# Patient Record
Sex: Female | Born: 1945
Health system: Southern US, Community
[De-identification: ages and names within clinical notes are randomized; demographics above are authoritative.]

## PROBLEM LIST (undated history)

## (undated) DIAGNOSIS — E0500 Thyrotoxicosis with diffuse goiter without thyrotoxic crisis or storm: Secondary | ICD-10-CM

## (undated) DIAGNOSIS — J45909 Unspecified asthma, uncomplicated: Secondary | ICD-10-CM

## (undated) DIAGNOSIS — M1990 Unspecified osteoarthritis, unspecified site: Secondary | ICD-10-CM

## (undated) DIAGNOSIS — I10 Essential (primary) hypertension: Secondary | ICD-10-CM

## (undated) DIAGNOSIS — E785 Hyperlipidemia, unspecified: Secondary | ICD-10-CM

## (undated) DIAGNOSIS — E039 Hypothyroidism, unspecified: Secondary | ICD-10-CM

## (undated) DIAGNOSIS — E119 Type 2 diabetes mellitus without complications: Secondary | ICD-10-CM

## (undated) DIAGNOSIS — R519 Headache, unspecified: Secondary | ICD-10-CM

## (undated) DIAGNOSIS — F32A Depression, unspecified: Secondary | ICD-10-CM

## (undated) DIAGNOSIS — K5900 Constipation, unspecified: Secondary | ICD-10-CM

## (undated) DIAGNOSIS — Z22322 Carrier or suspected carrier of Methicillin resistant Staphylococcus aureus: Secondary | ICD-10-CM

## (undated) DIAGNOSIS — F419 Anxiety disorder, unspecified: Secondary | ICD-10-CM

## (undated) DIAGNOSIS — K449 Diaphragmatic hernia without obstruction or gangrene: Secondary | ICD-10-CM

## (undated) DIAGNOSIS — D649 Anemia, unspecified: Secondary | ICD-10-CM

## (undated) DIAGNOSIS — K219 Gastro-esophageal reflux disease without esophagitis: Secondary | ICD-10-CM

## (undated) DIAGNOSIS — E210 Primary hyperparathyroidism: Secondary | ICD-10-CM

## (undated) HISTORY — PX: BREAST EXCISIONAL BIOPSY: SUR124

## (undated) HISTORY — PX: BREAST CYST EXCISION: SHX579

## (undated) HISTORY — DX: Carrier or suspected carrier of methicillin resistant Staphylococcus aureus: Z22.322

---

## 2003-11-23 ENCOUNTER — Ambulatory Visit: Payer: Self-pay | Admitting: Gastroenterology

## 2004-01-18 ENCOUNTER — Ambulatory Visit: Payer: Self-pay | Admitting: Unknown Physician Specialty

## 2004-02-20 ENCOUNTER — Ambulatory Visit: Payer: Self-pay | Admitting: Internal Medicine

## 2004-02-21 ENCOUNTER — Other Ambulatory Visit: Payer: Self-pay

## 2004-02-29 ENCOUNTER — Ambulatory Visit: Payer: Self-pay | Admitting: Unknown Physician Specialty

## 2004-08-24 ENCOUNTER — Emergency Department: Payer: Self-pay | Admitting: Emergency Medicine

## 2004-12-24 ENCOUNTER — Ambulatory Visit: Payer: Self-pay

## 2005-03-18 ENCOUNTER — Ambulatory Visit: Payer: Self-pay | Admitting: Internal Medicine

## 2005-04-28 ENCOUNTER — Ambulatory Visit: Payer: Self-pay | Admitting: Internal Medicine

## 2005-09-17 ENCOUNTER — Ambulatory Visit: Payer: Self-pay | Admitting: Internal Medicine

## 2006-04-01 ENCOUNTER — Ambulatory Visit: Payer: Self-pay | Admitting: Internal Medicine

## 2006-12-01 ENCOUNTER — Ambulatory Visit: Payer: Self-pay | Admitting: Gastroenterology

## 2007-04-27 ENCOUNTER — Ambulatory Visit: Payer: Self-pay | Admitting: Obstetrics and Gynecology

## 2007-12-03 ENCOUNTER — Ambulatory Visit: Payer: Self-pay

## 2007-12-06 ENCOUNTER — Observation Stay: Payer: Self-pay | Admitting: Internal Medicine

## 2008-05-11 ENCOUNTER — Ambulatory Visit: Payer: Self-pay | Admitting: Internal Medicine

## 2008-06-02 ENCOUNTER — Ambulatory Visit: Payer: Self-pay | Admitting: Internal Medicine

## 2009-05-23 ENCOUNTER — Ambulatory Visit: Payer: Self-pay | Admitting: Obstetrics and Gynecology

## 2009-09-02 ENCOUNTER — Ambulatory Visit: Payer: Self-pay | Admitting: Gastroenterology

## 2010-08-06 ENCOUNTER — Ambulatory Visit: Payer: Self-pay | Admitting: Internal Medicine

## 2011-09-15 ENCOUNTER — Ambulatory Visit: Payer: Self-pay | Admitting: Internal Medicine

## 2011-09-16 ENCOUNTER — Ambulatory Visit: Payer: Self-pay | Admitting: Internal Medicine

## 2011-10-12 ENCOUNTER — Ambulatory Visit: Payer: Self-pay | Admitting: Surgery

## 2011-10-12 HISTORY — PX: BREAST BIOPSY: SHX20

## 2011-10-14 LAB — PATHOLOGY REPORT

## 2012-04-18 ENCOUNTER — Ambulatory Visit: Payer: Self-pay | Admitting: Surgery

## 2013-10-06 ENCOUNTER — Ambulatory Visit: Payer: Self-pay | Admitting: Internal Medicine

## 2014-03-19 DIAGNOSIS — Z0000 Encounter for general adult medical examination without abnormal findings: Secondary | ICD-10-CM | POA: Diagnosis not present

## 2014-03-19 DIAGNOSIS — K219 Gastro-esophageal reflux disease without esophagitis: Secondary | ICD-10-CM | POA: Diagnosis not present

## 2014-03-19 DIAGNOSIS — D509 Iron deficiency anemia, unspecified: Secondary | ICD-10-CM | POA: Diagnosis not present

## 2014-03-19 DIAGNOSIS — I10 Essential (primary) hypertension: Secondary | ICD-10-CM | POA: Diagnosis not present

## 2014-03-26 DIAGNOSIS — E780 Pure hypercholesterolemia: Secondary | ICD-10-CM | POA: Diagnosis not present

## 2014-03-26 DIAGNOSIS — H9313 Tinnitus, bilateral: Secondary | ICD-10-CM | POA: Diagnosis not present

## 2014-03-26 DIAGNOSIS — H6121 Impacted cerumen, right ear: Secondary | ICD-10-CM | POA: Diagnosis not present

## 2014-03-26 DIAGNOSIS — I10 Essential (primary) hypertension: Secondary | ICD-10-CM | POA: Diagnosis not present

## 2014-04-24 DIAGNOSIS — H9311 Tinnitus, right ear: Secondary | ICD-10-CM | POA: Diagnosis not present

## 2014-04-24 DIAGNOSIS — H6060 Unspecified chronic otitis externa, unspecified ear: Secondary | ICD-10-CM | POA: Diagnosis not present

## 2014-04-24 DIAGNOSIS — E041 Nontoxic single thyroid nodule: Secondary | ICD-10-CM | POA: Diagnosis not present

## 2014-04-24 DIAGNOSIS — H6980 Other specified disorders of Eustachian tube, unspecified ear: Secondary | ICD-10-CM | POA: Diagnosis not present

## 2014-05-02 ENCOUNTER — Ambulatory Visit
Admit: 2014-05-02 | Disposition: A | Discharge status: Home / Self Care | Payer: Self-pay | Attending: Unknown Physician Specialty | Admitting: Unknown Physician Specialty

## 2014-05-02 DIAGNOSIS — E042 Nontoxic multinodular goiter: Secondary | ICD-10-CM | POA: Diagnosis not present

## 2014-05-02 DIAGNOSIS — E041 Nontoxic single thyroid nodule: Secondary | ICD-10-CM | POA: Diagnosis not present

## 2014-05-08 DIAGNOSIS — R05 Cough: Secondary | ICD-10-CM | POA: Diagnosis not present

## 2014-05-08 DIAGNOSIS — E041 Nontoxic single thyroid nodule: Secondary | ICD-10-CM | POA: Diagnosis not present

## 2014-05-08 DIAGNOSIS — H9313 Tinnitus, bilateral: Secondary | ICD-10-CM | POA: Diagnosis not present

## 2014-05-25 ENCOUNTER — Other Ambulatory Visit: Payer: Self-pay | Admitting: Internal Medicine

## 2014-05-25 DIAGNOSIS — R945 Abnormal results of liver function studies: Secondary | ICD-10-CM

## 2014-05-25 DIAGNOSIS — R748 Abnormal levels of other serum enzymes: Secondary | ICD-10-CM | POA: Diagnosis not present

## 2014-05-25 DIAGNOSIS — I10 Essential (primary) hypertension: Secondary | ICD-10-CM | POA: Diagnosis not present

## 2014-05-25 DIAGNOSIS — L299 Pruritus, unspecified: Secondary | ICD-10-CM | POA: Diagnosis not present

## 2014-05-25 DIAGNOSIS — R7989 Other specified abnormal findings of blood chemistry: Secondary | ICD-10-CM

## 2014-05-30 ENCOUNTER — Ambulatory Visit
Admission: RE | Admit: 2014-05-30 | Discharge: 2014-05-30 | Disposition: A | Discharge status: Home / Self Care | Payer: Commercial Managed Care - HMO | Source: Ambulatory Visit | Attending: Internal Medicine | Admitting: Internal Medicine

## 2014-05-30 DIAGNOSIS — N816 Rectocele: Secondary | ICD-10-CM | POA: Diagnosis not present

## 2014-05-30 DIAGNOSIS — R7989 Other specified abnormal findings of blood chemistry: Secondary | ICD-10-CM | POA: Diagnosis not present

## 2014-05-30 DIAGNOSIS — N201 Calculus of ureter: Secondary | ICD-10-CM | POA: Diagnosis not present

## 2014-05-30 DIAGNOSIS — R945 Abnormal results of liver function studies: Secondary | ICD-10-CM

## 2014-05-30 HISTORY — DX: Essential (primary) hypertension: I10

## 2014-05-30 MED ORDER — IOHEXOL 300 MG/ML  SOLN
100.0000 mL | Freq: Once | INTRAMUSCULAR | Status: AC | PRN
  Administered 2014-05-30: 100 mL via INTRAVENOUS

## 2014-05-31 DIAGNOSIS — R7989 Other specified abnormal findings of blood chemistry: Secondary | ICD-10-CM | POA: Diagnosis not present

## 2014-05-31 DIAGNOSIS — I10 Essential (primary) hypertension: Secondary | ICD-10-CM | POA: Diagnosis not present

## 2014-05-31 DIAGNOSIS — L299 Pruritus, unspecified: Secondary | ICD-10-CM | POA: Diagnosis not present

## 2014-06-14 DIAGNOSIS — R7989 Other specified abnormal findings of blood chemistry: Secondary | ICD-10-CM | POA: Diagnosis not present

## 2014-06-14 DIAGNOSIS — L299 Pruritus, unspecified: Secondary | ICD-10-CM | POA: Diagnosis not present

## 2014-06-14 DIAGNOSIS — I10 Essential (primary) hypertension: Secondary | ICD-10-CM | POA: Diagnosis not present

## 2014-07-06 DIAGNOSIS — I10 Essential (primary) hypertension: Secondary | ICD-10-CM | POA: Diagnosis not present

## 2014-07-06 DIAGNOSIS — R7989 Other specified abnormal findings of blood chemistry: Secondary | ICD-10-CM | POA: Diagnosis not present

## 2014-07-06 DIAGNOSIS — R05 Cough: Secondary | ICD-10-CM | POA: Diagnosis not present

## 2014-07-06 DIAGNOSIS — D509 Iron deficiency anemia, unspecified: Secondary | ICD-10-CM | POA: Diagnosis not present

## 2014-08-10 DIAGNOSIS — R300 Dysuria: Secondary | ICD-10-CM | POA: Diagnosis not present

## 2014-10-05 DIAGNOSIS — H6121 Impacted cerumen, right ear: Secondary | ICD-10-CM | POA: Diagnosis not present

## 2014-10-05 DIAGNOSIS — R7989 Other specified abnormal findings of blood chemistry: Secondary | ICD-10-CM | POA: Diagnosis not present

## 2014-10-05 DIAGNOSIS — H9313 Tinnitus, bilateral: Secondary | ICD-10-CM | POA: Diagnosis not present

## 2014-10-05 DIAGNOSIS — I10 Essential (primary) hypertension: Secondary | ICD-10-CM | POA: Diagnosis not present

## 2014-10-05 DIAGNOSIS — E780 Pure hypercholesterolemia: Secondary | ICD-10-CM | POA: Diagnosis not present

## 2014-10-12 ENCOUNTER — Other Ambulatory Visit: Payer: Self-pay | Admitting: Internal Medicine

## 2014-10-12 DIAGNOSIS — K210 Gastro-esophageal reflux disease with esophagitis: Secondary | ICD-10-CM | POA: Diagnosis not present

## 2014-10-12 DIAGNOSIS — D508 Other iron deficiency anemias: Secondary | ICD-10-CM | POA: Diagnosis not present

## 2014-10-12 DIAGNOSIS — I10 Essential (primary) hypertension: Secondary | ICD-10-CM | POA: Diagnosis not present

## 2014-10-12 DIAGNOSIS — E559 Vitamin D deficiency, unspecified: Secondary | ICD-10-CM | POA: Diagnosis not present

## 2014-10-12 DIAGNOSIS — Z139 Encounter for screening, unspecified: Secondary | ICD-10-CM | POA: Diagnosis not present

## 2014-10-12 DIAGNOSIS — Z1231 Encounter for screening mammogram for malignant neoplasm of breast: Secondary | ICD-10-CM

## 2014-10-12 DIAGNOSIS — Z0000 Encounter for general adult medical examination without abnormal findings: Secondary | ICD-10-CM | POA: Diagnosis not present

## 2014-10-19 ENCOUNTER — Ambulatory Visit: Payer: Commercial Managed Care - HMO

## 2014-10-19 ENCOUNTER — Other Ambulatory Visit: Payer: Self-pay | Admitting: Internal Medicine

## 2014-10-19 ENCOUNTER — Ambulatory Visit
Admission: RE | Admit: 2014-10-19 | Discharge: 2014-10-19 | Disposition: A | Discharge status: Home / Self Care | Payer: Commercial Managed Care - HMO | Source: Ambulatory Visit | Attending: Internal Medicine | Admitting: Internal Medicine

## 2014-10-19 DIAGNOSIS — Z1231 Encounter for screening mammogram for malignant neoplasm of breast: Secondary | ICD-10-CM | POA: Diagnosis not present

## 2015-04-02 DIAGNOSIS — Z0000 Encounter for general adult medical examination without abnormal findings: Secondary | ICD-10-CM | POA: Diagnosis not present

## 2015-04-02 DIAGNOSIS — K210 Gastro-esophageal reflux disease with esophagitis: Secondary | ICD-10-CM | POA: Diagnosis not present

## 2015-04-02 DIAGNOSIS — E559 Vitamin D deficiency, unspecified: Secondary | ICD-10-CM | POA: Diagnosis not present

## 2015-04-02 DIAGNOSIS — Z139 Encounter for screening, unspecified: Secondary | ICD-10-CM | POA: Diagnosis not present

## 2015-04-02 DIAGNOSIS — I10 Essential (primary) hypertension: Secondary | ICD-10-CM | POA: Diagnosis not present

## 2015-04-02 DIAGNOSIS — D508 Other iron deficiency anemias: Secondary | ICD-10-CM | POA: Diagnosis not present

## 2015-04-09 DIAGNOSIS — H6121 Impacted cerumen, right ear: Secondary | ICD-10-CM | POA: Diagnosis not present

## 2015-04-09 DIAGNOSIS — I10 Essential (primary) hypertension: Secondary | ICD-10-CM | POA: Diagnosis not present

## 2015-04-09 DIAGNOSIS — E559 Vitamin D deficiency, unspecified: Secondary | ICD-10-CM | POA: Diagnosis not present

## 2015-04-09 DIAGNOSIS — E7800 Pure hypercholesterolemia, unspecified: Secondary | ICD-10-CM | POA: Diagnosis not present

## 2015-04-09 DIAGNOSIS — M25561 Pain in right knee: Secondary | ICD-10-CM | POA: Diagnosis not present

## 2015-04-09 DIAGNOSIS — K210 Gastro-esophageal reflux disease with esophagitis: Secondary | ICD-10-CM | POA: Diagnosis not present

## 2015-04-18 DIAGNOSIS — H6123 Impacted cerumen, bilateral: Secondary | ICD-10-CM | POA: Diagnosis not present

## 2015-04-18 DIAGNOSIS — K219 Gastro-esophageal reflux disease without esophagitis: Secondary | ICD-10-CM | POA: Diagnosis not present

## 2015-04-18 DIAGNOSIS — J301 Allergic rhinitis due to pollen: Secondary | ICD-10-CM | POA: Diagnosis not present

## 2015-10-08 DIAGNOSIS — Z23 Encounter for immunization: Secondary | ICD-10-CM | POA: Diagnosis not present

## 2015-11-18 DIAGNOSIS — E559 Vitamin D deficiency, unspecified: Secondary | ICD-10-CM | POA: Diagnosis not present

## 2015-11-18 DIAGNOSIS — E7800 Pure hypercholesterolemia, unspecified: Secondary | ICD-10-CM | POA: Diagnosis not present

## 2015-11-18 DIAGNOSIS — I10 Essential (primary) hypertension: Secondary | ICD-10-CM | POA: Diagnosis not present

## 2015-11-18 DIAGNOSIS — H6121 Impacted cerumen, right ear: Secondary | ICD-10-CM | POA: Diagnosis not present

## 2015-11-18 DIAGNOSIS — K210 Gastro-esophageal reflux disease with esophagitis: Secondary | ICD-10-CM | POA: Diagnosis not present

## 2015-11-18 DIAGNOSIS — M25561 Pain in right knee: Secondary | ICD-10-CM | POA: Diagnosis not present

## 2015-11-25 DIAGNOSIS — E559 Vitamin D deficiency, unspecified: Secondary | ICD-10-CM | POA: Diagnosis not present

## 2015-11-25 DIAGNOSIS — Z1231 Encounter for screening mammogram for malignant neoplasm of breast: Secondary | ICD-10-CM | POA: Diagnosis not present

## 2015-11-25 DIAGNOSIS — Z0000 Encounter for general adult medical examination without abnormal findings: Secondary | ICD-10-CM | POA: Diagnosis not present

## 2015-11-25 DIAGNOSIS — I10 Essential (primary) hypertension: Secondary | ICD-10-CM | POA: Diagnosis not present

## 2015-11-25 DIAGNOSIS — E7800 Pure hypercholesterolemia, unspecified: Secondary | ICD-10-CM | POA: Diagnosis not present

## 2015-11-25 DIAGNOSIS — J069 Acute upper respiratory infection, unspecified: Secondary | ICD-10-CM | POA: Diagnosis not present

## 2015-11-25 DIAGNOSIS — K210 Gastro-esophageal reflux disease with esophagitis: Secondary | ICD-10-CM | POA: Diagnosis not present

## 2015-11-27 ENCOUNTER — Other Ambulatory Visit: Payer: Self-pay | Admitting: Internal Medicine

## 2015-11-27 DIAGNOSIS — Z1231 Encounter for screening mammogram for malignant neoplasm of breast: Secondary | ICD-10-CM

## 2016-01-07 ENCOUNTER — Ambulatory Visit
Admission: RE | Admit: 2016-01-07 | Discharge: 2016-01-07 | Disposition: A | Discharge status: Home / Self Care | Payer: Commercial Managed Care - HMO | Source: Ambulatory Visit | Attending: Internal Medicine | Admitting: Internal Medicine

## 2016-01-07 DIAGNOSIS — Z1231 Encounter for screening mammogram for malignant neoplasm of breast: Secondary | ICD-10-CM | POA: Insufficient documentation

## 2016-04-07 DIAGNOSIS — M722 Plantar fascial fibromatosis: Secondary | ICD-10-CM | POA: Diagnosis not present

## 2016-04-28 DIAGNOSIS — M722 Plantar fascial fibromatosis: Secondary | ICD-10-CM | POA: Diagnosis not present

## 2016-05-18 DIAGNOSIS — Z0000 Encounter for general adult medical examination without abnormal findings: Secondary | ICD-10-CM | POA: Diagnosis not present

## 2016-05-18 DIAGNOSIS — I10 Essential (primary) hypertension: Secondary | ICD-10-CM | POA: Diagnosis not present

## 2016-05-18 DIAGNOSIS — K210 Gastro-esophageal reflux disease with esophagitis: Secondary | ICD-10-CM | POA: Diagnosis not present

## 2016-05-18 DIAGNOSIS — E7800 Pure hypercholesterolemia, unspecified: Secondary | ICD-10-CM | POA: Diagnosis not present

## 2016-05-18 DIAGNOSIS — J069 Acute upper respiratory infection, unspecified: Secondary | ICD-10-CM | POA: Diagnosis not present

## 2016-05-18 DIAGNOSIS — E559 Vitamin D deficiency, unspecified: Secondary | ICD-10-CM | POA: Diagnosis not present

## 2016-05-25 DIAGNOSIS — I10 Essential (primary) hypertension: Secondary | ICD-10-CM | POA: Diagnosis not present

## 2016-05-25 DIAGNOSIS — F329 Major depressive disorder, single episode, unspecified: Secondary | ICD-10-CM | POA: Diagnosis not present

## 2016-05-25 DIAGNOSIS — Z0000 Encounter for general adult medical examination without abnormal findings: Secondary | ICD-10-CM | POA: Diagnosis not present

## 2016-05-25 DIAGNOSIS — E7800 Pure hypercholesterolemia, unspecified: Secondary | ICD-10-CM | POA: Diagnosis not present

## 2016-05-25 DIAGNOSIS — E559 Vitamin D deficiency, unspecified: Secondary | ICD-10-CM | POA: Diagnosis not present

## 2016-05-25 DIAGNOSIS — D649 Anemia, unspecified: Secondary | ICD-10-CM | POA: Diagnosis not present

## 2016-07-14 DIAGNOSIS — H25813 Combined forms of age-related cataract, bilateral: Secondary | ICD-10-CM | POA: Diagnosis not present

## 2016-07-14 DIAGNOSIS — H524 Presbyopia: Secondary | ICD-10-CM | POA: Diagnosis not present

## 2016-07-23 DIAGNOSIS — Z0100 Encounter for examination of eyes and vision without abnormal findings: Secondary | ICD-10-CM | POA: Diagnosis not present

## 2016-10-16 DIAGNOSIS — E0590 Thyrotoxicosis, unspecified without thyrotoxic crisis or storm: Secondary | ICD-10-CM | POA: Diagnosis not present

## 2016-10-16 DIAGNOSIS — R000 Tachycardia, unspecified: Secondary | ICD-10-CM | POA: Diagnosis not present

## 2016-10-16 DIAGNOSIS — M170 Bilateral primary osteoarthritis of knee: Secondary | ICD-10-CM | POA: Diagnosis not present

## 2016-10-16 DIAGNOSIS — R600 Localized edema: Secondary | ICD-10-CM | POA: Diagnosis not present

## 2016-10-16 DIAGNOSIS — R05 Cough: Secondary | ICD-10-CM | POA: Diagnosis not present

## 2016-10-19 DIAGNOSIS — E0590 Thyrotoxicosis, unspecified without thyrotoxic crisis or storm: Secondary | ICD-10-CM | POA: Diagnosis not present

## 2016-10-19 DIAGNOSIS — R600 Localized edema: Secondary | ICD-10-CM | POA: Diagnosis not present

## 2016-10-19 DIAGNOSIS — R05 Cough: Secondary | ICD-10-CM | POA: Diagnosis not present

## 2016-10-19 DIAGNOSIS — M170 Bilateral primary osteoarthritis of knee: Secondary | ICD-10-CM | POA: Diagnosis not present

## 2016-10-19 DIAGNOSIS — R000 Tachycardia, unspecified: Secondary | ICD-10-CM | POA: Diagnosis not present

## 2016-10-23 DIAGNOSIS — R000 Tachycardia, unspecified: Secondary | ICD-10-CM | POA: Diagnosis not present

## 2016-10-23 DIAGNOSIS — R05 Cough: Secondary | ICD-10-CM | POA: Diagnosis not present

## 2016-10-23 DIAGNOSIS — R600 Localized edema: Secondary | ICD-10-CM | POA: Diagnosis not present

## 2016-10-30 DIAGNOSIS — D508 Other iron deficiency anemias: Secondary | ICD-10-CM | POA: Diagnosis not present

## 2016-10-30 DIAGNOSIS — R05 Cough: Secondary | ICD-10-CM | POA: Diagnosis not present

## 2016-10-30 DIAGNOSIS — J019 Acute sinusitis, unspecified: Secondary | ICD-10-CM | POA: Diagnosis not present

## 2016-10-30 DIAGNOSIS — I10 Essential (primary) hypertension: Secondary | ICD-10-CM | POA: Diagnosis not present

## 2016-10-30 DIAGNOSIS — E7800 Pure hypercholesterolemia, unspecified: Secondary | ICD-10-CM | POA: Diagnosis not present

## 2016-10-30 DIAGNOSIS — K210 Gastro-esophageal reflux disease with esophagitis: Secondary | ICD-10-CM | POA: Diagnosis not present

## 2016-10-30 DIAGNOSIS — E0500 Thyrotoxicosis with diffuse goiter without thyrotoxic crisis or storm: Secondary | ICD-10-CM | POA: Diagnosis not present

## 2016-11-16 DIAGNOSIS — E7800 Pure hypercholesterolemia, unspecified: Secondary | ICD-10-CM | POA: Diagnosis not present

## 2016-11-16 DIAGNOSIS — I10 Essential (primary) hypertension: Secondary | ICD-10-CM | POA: Diagnosis not present

## 2016-11-16 DIAGNOSIS — F329 Major depressive disorder, single episode, unspecified: Secondary | ICD-10-CM | POA: Diagnosis not present

## 2016-11-16 DIAGNOSIS — D649 Anemia, unspecified: Secondary | ICD-10-CM | POA: Diagnosis not present

## 2016-11-16 DIAGNOSIS — R7989 Other specified abnormal findings of blood chemistry: Secondary | ICD-10-CM | POA: Diagnosis not present

## 2016-11-23 DIAGNOSIS — I10 Essential (primary) hypertension: Secondary | ICD-10-CM | POA: Diagnosis not present

## 2016-11-23 DIAGNOSIS — K210 Gastro-esophageal reflux disease with esophagitis: Secondary | ICD-10-CM | POA: Diagnosis not present

## 2016-11-23 DIAGNOSIS — Z1231 Encounter for screening mammogram for malignant neoplasm of breast: Secondary | ICD-10-CM | POA: Diagnosis not present

## 2016-11-23 DIAGNOSIS — J45991 Cough variant asthma: Secondary | ICD-10-CM | POA: Diagnosis not present

## 2016-11-23 DIAGNOSIS — Z23 Encounter for immunization: Secondary | ICD-10-CM | POA: Diagnosis not present

## 2016-11-23 DIAGNOSIS — Z0000 Encounter for general adult medical examination without abnormal findings: Secondary | ICD-10-CM | POA: Diagnosis not present

## 2016-11-23 DIAGNOSIS — F411 Generalized anxiety disorder: Secondary | ICD-10-CM | POA: Diagnosis not present

## 2016-11-23 DIAGNOSIS — E7800 Pure hypercholesterolemia, unspecified: Secondary | ICD-10-CM | POA: Diagnosis not present

## 2016-11-24 ENCOUNTER — Other Ambulatory Visit: Payer: Self-pay | Admitting: Internal Medicine

## 2016-11-24 DIAGNOSIS — Z1231 Encounter for screening mammogram for malignant neoplasm of breast: Secondary | ICD-10-CM

## 2017-01-18 ENCOUNTER — Ambulatory Visit
Admission: RE | Admit: 2017-01-18 | Discharge: 2017-01-18 | Disposition: A | Discharge status: Home / Self Care | Payer: Commercial Managed Care - HMO | Source: Ambulatory Visit | Attending: Internal Medicine | Admitting: Internal Medicine

## 2017-01-18 DIAGNOSIS — Z1231 Encounter for screening mammogram for malignant neoplasm of breast: Secondary | ICD-10-CM | POA: Insufficient documentation

## 2017-01-19 DIAGNOSIS — M722 Plantar fascial fibromatosis: Secondary | ICD-10-CM | POA: Diagnosis not present

## 2017-02-09 DIAGNOSIS — M722 Plantar fascial fibromatosis: Secondary | ICD-10-CM | POA: Diagnosis not present

## 2017-02-11 DIAGNOSIS — E210 Primary hyperparathyroidism: Secondary | ICD-10-CM | POA: Diagnosis not present

## 2017-02-11 DIAGNOSIS — E0500 Thyrotoxicosis with diffuse goiter without thyrotoxic crisis or storm: Secondary | ICD-10-CM | POA: Diagnosis not present

## 2017-02-11 DIAGNOSIS — M8588 Other specified disorders of bone density and structure, other site: Secondary | ICD-10-CM | POA: Diagnosis not present

## 2017-02-11 DIAGNOSIS — Z780 Asymptomatic menopausal state: Secondary | ICD-10-CM | POA: Diagnosis not present

## 2017-03-01 DIAGNOSIS — E7800 Pure hypercholesterolemia, unspecified: Secondary | ICD-10-CM | POA: Diagnosis not present

## 2017-03-01 DIAGNOSIS — E210 Primary hyperparathyroidism: Secondary | ICD-10-CM | POA: Diagnosis not present

## 2017-03-01 DIAGNOSIS — M79605 Pain in left leg: Secondary | ICD-10-CM | POA: Diagnosis not present

## 2017-03-01 DIAGNOSIS — I10 Essential (primary) hypertension: Secondary | ICD-10-CM | POA: Diagnosis not present

## 2017-03-01 DIAGNOSIS — E0500 Thyrotoxicosis with diffuse goiter without thyrotoxic crisis or storm: Secondary | ICD-10-CM | POA: Diagnosis not present

## 2017-05-19 DIAGNOSIS — I10 Essential (primary) hypertension: Secondary | ICD-10-CM | POA: Diagnosis not present

## 2017-05-19 DIAGNOSIS — M25562 Pain in left knee: Secondary | ICD-10-CM | POA: Diagnosis not present

## 2017-05-19 DIAGNOSIS — M1712 Unilateral primary osteoarthritis, left knee: Secondary | ICD-10-CM | POA: Diagnosis not present

## 2017-05-25 DIAGNOSIS — E0590 Thyrotoxicosis, unspecified without thyrotoxic crisis or storm: Secondary | ICD-10-CM | POA: Diagnosis not present

## 2017-06-01 DIAGNOSIS — E0500 Thyrotoxicosis with diffuse goiter without thyrotoxic crisis or storm: Secondary | ICD-10-CM | POA: Diagnosis not present

## 2017-06-01 DIAGNOSIS — E559 Vitamin D deficiency, unspecified: Secondary | ICD-10-CM | POA: Diagnosis not present

## 2017-06-01 DIAGNOSIS — M8588 Other specified disorders of bone density and structure, other site: Secondary | ICD-10-CM | POA: Diagnosis not present

## 2017-06-01 DIAGNOSIS — R21 Rash and other nonspecific skin eruption: Secondary | ICD-10-CM | POA: Diagnosis not present

## 2017-06-01 DIAGNOSIS — E210 Primary hyperparathyroidism: Secondary | ICD-10-CM | POA: Diagnosis not present

## 2017-06-21 DIAGNOSIS — I10 Essential (primary) hypertension: Secondary | ICD-10-CM | POA: Diagnosis not present

## 2017-06-21 DIAGNOSIS — R7989 Other specified abnormal findings of blood chemistry: Secondary | ICD-10-CM | POA: Diagnosis not present

## 2017-06-21 DIAGNOSIS — E210 Primary hyperparathyroidism: Secondary | ICD-10-CM | POA: Diagnosis not present

## 2017-06-21 DIAGNOSIS — E7800 Pure hypercholesterolemia, unspecified: Secondary | ICD-10-CM | POA: Diagnosis not present

## 2017-07-27 DIAGNOSIS — M542 Cervicalgia: Secondary | ICD-10-CM | POA: Diagnosis not present

## 2017-07-27 DIAGNOSIS — E0500 Thyrotoxicosis with diffuse goiter without thyrotoxic crisis or storm: Secondary | ICD-10-CM | POA: Diagnosis not present

## 2017-07-27 DIAGNOSIS — M791 Myalgia, unspecified site: Secondary | ICD-10-CM | POA: Diagnosis not present

## 2017-08-10 DIAGNOSIS — E0500 Thyrotoxicosis with diffuse goiter without thyrotoxic crisis or storm: Secondary | ICD-10-CM | POA: Diagnosis not present

## 2017-08-10 DIAGNOSIS — M542 Cervicalgia: Secondary | ICD-10-CM | POA: Diagnosis not present

## 2017-08-24 DIAGNOSIS — E210 Primary hyperparathyroidism: Secondary | ICD-10-CM | POA: Diagnosis not present

## 2017-08-24 DIAGNOSIS — M79641 Pain in right hand: Secondary | ICD-10-CM | POA: Diagnosis not present

## 2017-08-24 DIAGNOSIS — E0500 Thyrotoxicosis with diffuse goiter without thyrotoxic crisis or storm: Secondary | ICD-10-CM | POA: Diagnosis not present

## 2017-08-24 DIAGNOSIS — M79642 Pain in left hand: Secondary | ICD-10-CM | POA: Diagnosis not present

## 2017-08-24 DIAGNOSIS — M1712 Unilateral primary osteoarthritis, left knee: Secondary | ICD-10-CM | POA: Diagnosis not present

## 2017-08-30 DIAGNOSIS — E559 Vitamin D deficiency, unspecified: Secondary | ICD-10-CM | POA: Diagnosis not present

## 2017-08-30 DIAGNOSIS — E210 Primary hyperparathyroidism: Secondary | ICD-10-CM | POA: Diagnosis not present

## 2017-08-30 DIAGNOSIS — E0500 Thyrotoxicosis with diffuse goiter without thyrotoxic crisis or storm: Secondary | ICD-10-CM | POA: Diagnosis not present

## 2017-08-30 DIAGNOSIS — M8588 Other specified disorders of bone density and structure, other site: Secondary | ICD-10-CM | POA: Diagnosis not present

## 2017-09-03 DIAGNOSIS — R768 Other specified abnormal immunological findings in serum: Secondary | ICD-10-CM | POA: Diagnosis not present

## 2017-09-03 DIAGNOSIS — M1712 Unilateral primary osteoarthritis, left knee: Secondary | ICD-10-CM | POA: Diagnosis not present

## 2017-09-03 DIAGNOSIS — M19041 Primary osteoarthritis, right hand: Secondary | ICD-10-CM | POA: Diagnosis not present

## 2017-09-03 DIAGNOSIS — M20021 Boutonniere deformity of right finger(s): Secondary | ICD-10-CM | POA: Diagnosis not present

## 2017-09-03 DIAGNOSIS — M19042 Primary osteoarthritis, left hand: Secondary | ICD-10-CM | POA: Diagnosis not present

## 2017-09-06 DIAGNOSIS — E210 Primary hyperparathyroidism: Secondary | ICD-10-CM | POA: Diagnosis not present

## 2017-09-06 DIAGNOSIS — E0500 Thyrotoxicosis with diffuse goiter without thyrotoxic crisis or storm: Secondary | ICD-10-CM | POA: Diagnosis not present

## 2017-09-06 DIAGNOSIS — M8588 Other specified disorders of bone density and structure, other site: Secondary | ICD-10-CM | POA: Diagnosis not present

## 2017-09-27 DIAGNOSIS — R21 Rash and other nonspecific skin eruption: Secondary | ICD-10-CM | POA: Diagnosis not present

## 2017-09-27 DIAGNOSIS — E7800 Pure hypercholesterolemia, unspecified: Secondary | ICD-10-CM | POA: Diagnosis not present

## 2017-09-27 DIAGNOSIS — E0500 Thyrotoxicosis with diffuse goiter without thyrotoxic crisis or storm: Secondary | ICD-10-CM | POA: Diagnosis not present

## 2017-09-27 DIAGNOSIS — I10 Essential (primary) hypertension: Secondary | ICD-10-CM | POA: Diagnosis not present

## 2017-09-27 DIAGNOSIS — T50995A Adverse effect of other drugs, medicaments and biological substances, initial encounter: Secondary | ICD-10-CM | POA: Diagnosis not present

## 2017-10-19 DIAGNOSIS — Z23 Encounter for immunization: Secondary | ICD-10-CM | POA: Diagnosis not present

## 2017-11-05 DIAGNOSIS — E0500 Thyrotoxicosis with diffuse goiter without thyrotoxic crisis or storm: Secondary | ICD-10-CM | POA: Diagnosis not present

## 2017-11-05 DIAGNOSIS — E210 Primary hyperparathyroidism: Secondary | ICD-10-CM | POA: Diagnosis not present

## 2017-11-05 DIAGNOSIS — M542 Cervicalgia: Secondary | ICD-10-CM | POA: Diagnosis not present

## 2017-12-21 DIAGNOSIS — E0500 Thyrotoxicosis with diffuse goiter without thyrotoxic crisis or storm: Secondary | ICD-10-CM | POA: Diagnosis not present

## 2017-12-21 DIAGNOSIS — E7800 Pure hypercholesterolemia, unspecified: Secondary | ICD-10-CM | POA: Diagnosis not present

## 2017-12-21 DIAGNOSIS — I10 Essential (primary) hypertension: Secondary | ICD-10-CM | POA: Diagnosis not present

## 2017-12-28 DIAGNOSIS — Z0000 Encounter for general adult medical examination without abnormal findings: Secondary | ICD-10-CM | POA: Diagnosis not present

## 2017-12-28 DIAGNOSIS — R0789 Other chest pain: Secondary | ICD-10-CM | POA: Diagnosis not present

## 2017-12-28 DIAGNOSIS — R7989 Other specified abnormal findings of blood chemistry: Secondary | ICD-10-CM | POA: Diagnosis not present

## 2017-12-28 DIAGNOSIS — E7800 Pure hypercholesterolemia, unspecified: Secondary | ICD-10-CM | POA: Diagnosis not present

## 2017-12-28 DIAGNOSIS — I10 Essential (primary) hypertension: Secondary | ICD-10-CM | POA: Diagnosis not present

## 2017-12-28 DIAGNOSIS — F3342 Major depressive disorder, recurrent, in full remission: Secondary | ICD-10-CM | POA: Diagnosis not present

## 2017-12-28 DIAGNOSIS — Z789 Other specified health status: Secondary | ICD-10-CM | POA: Diagnosis not present

## 2017-12-28 DIAGNOSIS — E210 Primary hyperparathyroidism: Secondary | ICD-10-CM | POA: Diagnosis not present

## 2017-12-31 DIAGNOSIS — E210 Primary hyperparathyroidism: Secondary | ICD-10-CM | POA: Diagnosis not present

## 2018-01-03 ENCOUNTER — Other Ambulatory Visit: Payer: Self-pay | Admitting: Internal Medicine

## 2018-01-03 DIAGNOSIS — Z1231 Encounter for screening mammogram for malignant neoplasm of breast: Secondary | ICD-10-CM

## 2018-01-07 DIAGNOSIS — E210 Primary hyperparathyroidism: Secondary | ICD-10-CM | POA: Diagnosis not present

## 2018-01-07 DIAGNOSIS — M8588 Other specified disorders of bone density and structure, other site: Secondary | ICD-10-CM | POA: Diagnosis not present

## 2018-01-07 DIAGNOSIS — E0500 Thyrotoxicosis with diffuse goiter without thyrotoxic crisis or storm: Secondary | ICD-10-CM | POA: Diagnosis not present

## 2018-01-07 DIAGNOSIS — E559 Vitamin D deficiency, unspecified: Secondary | ICD-10-CM | POA: Diagnosis not present

## 2018-01-25 ENCOUNTER — Ambulatory Visit
Admission: RE | Admit: 2018-01-25 | Discharge: 2018-01-25 | Disposition: A | Discharge status: Home / Self Care | Payer: Medicare HMO | Source: Ambulatory Visit | Attending: Internal Medicine | Admitting: Internal Medicine

## 2018-01-25 DIAGNOSIS — Z1231 Encounter for screening mammogram for malignant neoplasm of breast: Secondary | ICD-10-CM | POA: Diagnosis not present

## 2018-01-28 DIAGNOSIS — M1712 Unilateral primary osteoarthritis, left knee: Secondary | ICD-10-CM | POA: Diagnosis not present

## 2018-01-28 DIAGNOSIS — M20021 Boutonniere deformity of right finger(s): Secondary | ICD-10-CM | POA: Diagnosis not present

## 2018-01-28 DIAGNOSIS — R768 Other specified abnormal immunological findings in serum: Secondary | ICD-10-CM | POA: Diagnosis not present

## 2018-04-26 DIAGNOSIS — R8290 Unspecified abnormal findings in urine: Secondary | ICD-10-CM | POA: Diagnosis not present

## 2018-04-26 DIAGNOSIS — R7989 Other specified abnormal findings of blood chemistry: Secondary | ICD-10-CM | POA: Diagnosis not present

## 2018-04-26 DIAGNOSIS — F3342 Major depressive disorder, recurrent, in full remission: Secondary | ICD-10-CM | POA: Diagnosis not present

## 2018-04-26 DIAGNOSIS — Z0000 Encounter for general adult medical examination without abnormal findings: Secondary | ICD-10-CM | POA: Diagnosis not present

## 2018-04-26 DIAGNOSIS — I10 Essential (primary) hypertension: Secondary | ICD-10-CM | POA: Diagnosis not present

## 2018-04-26 DIAGNOSIS — E0500 Thyrotoxicosis with diffuse goiter without thyrotoxic crisis or storm: Secondary | ICD-10-CM | POA: Diagnosis not present

## 2018-04-26 DIAGNOSIS — E7800 Pure hypercholesterolemia, unspecified: Secondary | ICD-10-CM | POA: Diagnosis not present

## 2018-04-26 DIAGNOSIS — E210 Primary hyperparathyroidism: Secondary | ICD-10-CM | POA: Diagnosis not present

## 2018-04-26 DIAGNOSIS — Z789 Other specified health status: Secondary | ICD-10-CM | POA: Diagnosis not present

## 2018-04-26 DIAGNOSIS — E559 Vitamin D deficiency, unspecified: Secondary | ICD-10-CM | POA: Diagnosis not present

## 2018-04-26 DIAGNOSIS — R0789 Other chest pain: Secondary | ICD-10-CM | POA: Diagnosis not present

## 2018-04-29 DIAGNOSIS — R200 Anesthesia of skin: Secondary | ICD-10-CM | POA: Diagnosis not present

## 2018-04-29 DIAGNOSIS — E0500 Thyrotoxicosis with diffuse goiter without thyrotoxic crisis or storm: Secondary | ICD-10-CM | POA: Diagnosis not present

## 2018-04-29 DIAGNOSIS — I10 Essential (primary) hypertension: Secondary | ICD-10-CM | POA: Diagnosis not present

## 2018-04-29 DIAGNOSIS — F3342 Major depressive disorder, recurrent, in full remission: Secondary | ICD-10-CM | POA: Diagnosis not present

## 2018-04-29 DIAGNOSIS — M8588 Other specified disorders of bone density and structure, other site: Secondary | ICD-10-CM | POA: Diagnosis not present

## 2018-04-29 DIAGNOSIS — E7800 Pure hypercholesterolemia, unspecified: Secondary | ICD-10-CM | POA: Diagnosis not present

## 2018-04-29 DIAGNOSIS — E559 Vitamin D deficiency, unspecified: Secondary | ICD-10-CM | POA: Diagnosis not present

## 2018-04-29 DIAGNOSIS — E210 Primary hyperparathyroidism: Secondary | ICD-10-CM | POA: Diagnosis not present

## 2018-04-29 DIAGNOSIS — N390 Urinary tract infection, site not specified: Secondary | ICD-10-CM | POA: Diagnosis not present

## 2018-04-29 DIAGNOSIS — Z0000 Encounter for general adult medical examination without abnormal findings: Secondary | ICD-10-CM | POA: Diagnosis not present

## 2018-07-29 DIAGNOSIS — M1712 Unilateral primary osteoarthritis, left knee: Secondary | ICD-10-CM | POA: Diagnosis not present

## 2018-07-29 DIAGNOSIS — M20021 Boutonniere deformity of right finger(s): Secondary | ICD-10-CM | POA: Diagnosis not present

## 2018-07-29 DIAGNOSIS — R202 Paresthesia of skin: Secondary | ICD-10-CM | POA: Diagnosis not present

## 2018-07-29 DIAGNOSIS — M65312 Trigger thumb, left thumb: Secondary | ICD-10-CM | POA: Diagnosis not present

## 2018-07-29 DIAGNOSIS — R200 Anesthesia of skin: Secondary | ICD-10-CM | POA: Diagnosis not present

## 2018-07-29 DIAGNOSIS — R768 Other specified abnormal immunological findings in serum: Secondary | ICD-10-CM | POA: Diagnosis not present

## 2018-08-24 DIAGNOSIS — E0500 Thyrotoxicosis with diffuse goiter without thyrotoxic crisis or storm: Secondary | ICD-10-CM | POA: Diagnosis not present

## 2018-08-24 DIAGNOSIS — M8588 Other specified disorders of bone density and structure, other site: Secondary | ICD-10-CM | POA: Diagnosis not present

## 2018-08-24 DIAGNOSIS — I10 Essential (primary) hypertension: Secondary | ICD-10-CM | POA: Diagnosis not present

## 2018-08-24 DIAGNOSIS — Z0000 Encounter for general adult medical examination without abnormal findings: Secondary | ICD-10-CM | POA: Diagnosis not present

## 2018-08-24 DIAGNOSIS — N390 Urinary tract infection, site not specified: Secondary | ICD-10-CM | POA: Diagnosis not present

## 2018-08-24 DIAGNOSIS — E7800 Pure hypercholesterolemia, unspecified: Secondary | ICD-10-CM | POA: Diagnosis not present

## 2018-08-24 DIAGNOSIS — E559 Vitamin D deficiency, unspecified: Secondary | ICD-10-CM | POA: Diagnosis not present

## 2018-08-24 DIAGNOSIS — Z79899 Other long term (current) drug therapy: Secondary | ICD-10-CM | POA: Diagnosis not present

## 2018-08-24 DIAGNOSIS — F3342 Major depressive disorder, recurrent, in full remission: Secondary | ICD-10-CM | POA: Diagnosis not present

## 2018-08-24 DIAGNOSIS — E210 Primary hyperparathyroidism: Secondary | ICD-10-CM | POA: Diagnosis not present

## 2018-08-24 DIAGNOSIS — D649 Anemia, unspecified: Secondary | ICD-10-CM | POA: Diagnosis not present

## 2018-08-31 ENCOUNTER — Other Ambulatory Visit: Payer: Self-pay | Admitting: Internal Medicine

## 2018-08-31 DIAGNOSIS — E0500 Thyrotoxicosis with diffuse goiter without thyrotoxic crisis or storm: Secondary | ICD-10-CM | POA: Diagnosis not present

## 2018-08-31 DIAGNOSIS — E210 Primary hyperparathyroidism: Secondary | ICD-10-CM | POA: Diagnosis not present

## 2018-09-01 DIAGNOSIS — Z789 Other specified health status: Secondary | ICD-10-CM | POA: Diagnosis not present

## 2018-09-01 DIAGNOSIS — R200 Anesthesia of skin: Secondary | ICD-10-CM | POA: Diagnosis not present

## 2018-09-01 DIAGNOSIS — Z23 Encounter for immunization: Secondary | ICD-10-CM | POA: Diagnosis not present

## 2018-09-01 DIAGNOSIS — E0500 Thyrotoxicosis with diffuse goiter without thyrotoxic crisis or storm: Secondary | ICD-10-CM | POA: Diagnosis not present

## 2018-09-01 DIAGNOSIS — F3342 Major depressive disorder, recurrent, in full remission: Secondary | ICD-10-CM | POA: Diagnosis not present

## 2018-09-01 DIAGNOSIS — Z79899 Other long term (current) drug therapy: Secondary | ICD-10-CM | POA: Diagnosis not present

## 2018-09-01 DIAGNOSIS — I10 Essential (primary) hypertension: Secondary | ICD-10-CM | POA: Diagnosis not present

## 2018-09-01 DIAGNOSIS — E210 Primary hyperparathyroidism: Secondary | ICD-10-CM | POA: Diagnosis not present

## 2018-09-05 DIAGNOSIS — R200 Anesthesia of skin: Secondary | ICD-10-CM | POA: Diagnosis not present

## 2018-09-05 DIAGNOSIS — R202 Paresthesia of skin: Secondary | ICD-10-CM | POA: Diagnosis not present

## 2018-09-08 ENCOUNTER — Other Ambulatory Visit: Payer: Self-pay

## 2018-09-08 ENCOUNTER — Encounter
Admission: RE | Admit: 2018-09-08 | Discharge: 2018-09-08 | Disposition: A | Discharge status: Home / Self Care | Payer: Medicare HMO | Source: Ambulatory Visit | Attending: Internal Medicine | Admitting: Internal Medicine

## 2018-09-08 DIAGNOSIS — E0500 Thyrotoxicosis with diffuse goiter without thyrotoxic crisis or storm: Secondary | ICD-10-CM

## 2018-09-08 MED ORDER — SODIUM IODIDE I-123 7.4 MBQ CAPS
400.0000 | ORAL_CAPSULE | Freq: Once | ORAL | Status: AC
  Administered 2018-09-08: 434.39 via ORAL

## 2018-09-09 ENCOUNTER — Encounter
Admission: RE | Admit: 2018-09-09 | Discharge: 2018-09-09 | Disposition: A | Discharge status: Home / Self Care | Payer: Medicare HMO | Source: Ambulatory Visit | Attending: Internal Medicine | Admitting: Internal Medicine

## 2018-09-09 DIAGNOSIS — E0500 Thyrotoxicosis with diffuse goiter without thyrotoxic crisis or storm: Secondary | ICD-10-CM | POA: Diagnosis not present

## 2018-09-16 ENCOUNTER — Other Ambulatory Visit: Payer: Self-pay

## 2018-09-16 ENCOUNTER — Encounter
Admission: RE | Admit: 2018-09-16 | Discharge: 2018-09-16 | Disposition: A | Discharge status: Home / Self Care | Payer: Medicare HMO | Source: Ambulatory Visit | Attending: Internal Medicine | Admitting: Internal Medicine

## 2018-09-16 DIAGNOSIS — E0500 Thyrotoxicosis with diffuse goiter without thyrotoxic crisis or storm: Secondary | ICD-10-CM | POA: Insufficient documentation

## 2018-09-16 MED ORDER — SODIUM IODIDE I 131 CAPSULE
10.5040 | Freq: Once | INTRAVENOUS | Status: AC | PRN
  Administered 2018-09-16: 10:00:00 10.504 via ORAL

## 2018-10-17 DIAGNOSIS — R8290 Unspecified abnormal findings in urine: Secondary | ICD-10-CM | POA: Diagnosis not present

## 2018-10-17 DIAGNOSIS — R399 Unspecified symptoms and signs involving the genitourinary system: Secondary | ICD-10-CM | POA: Diagnosis not present

## 2018-10-24 DIAGNOSIS — G5603 Carpal tunnel syndrome, bilateral upper limbs: Secondary | ICD-10-CM | POA: Diagnosis not present

## 2018-10-28 DIAGNOSIS — E0500 Thyrotoxicosis with diffuse goiter without thyrotoxic crisis or storm: Secondary | ICD-10-CM | POA: Diagnosis not present

## 2018-10-31 DIAGNOSIS — E0500 Thyrotoxicosis with diffuse goiter without thyrotoxic crisis or storm: Secondary | ICD-10-CM | POA: Diagnosis not present

## 2019-01-03 DIAGNOSIS — E210 Primary hyperparathyroidism: Secondary | ICD-10-CM | POA: Diagnosis not present

## 2019-01-03 DIAGNOSIS — E0500 Thyrotoxicosis with diffuse goiter without thyrotoxic crisis or storm: Secondary | ICD-10-CM | POA: Diagnosis not present

## 2019-01-03 DIAGNOSIS — F3342 Major depressive disorder, recurrent, in full remission: Secondary | ICD-10-CM | POA: Diagnosis not present

## 2019-01-03 DIAGNOSIS — R8290 Unspecified abnormal findings in urine: Secondary | ICD-10-CM | POA: Diagnosis not present

## 2019-01-03 DIAGNOSIS — R202 Paresthesia of skin: Secondary | ICD-10-CM | POA: Diagnosis not present

## 2019-01-03 DIAGNOSIS — I10 Essential (primary) hypertension: Secondary | ICD-10-CM | POA: Diagnosis not present

## 2019-01-03 DIAGNOSIS — R200 Anesthesia of skin: Secondary | ICD-10-CM | POA: Diagnosis not present

## 2019-01-03 DIAGNOSIS — Z789 Other specified health status: Secondary | ICD-10-CM | POA: Diagnosis not present

## 2019-01-17 DIAGNOSIS — E0500 Thyrotoxicosis with diffuse goiter without thyrotoxic crisis or storm: Secondary | ICD-10-CM | POA: Diagnosis not present

## 2019-01-17 DIAGNOSIS — E210 Primary hyperparathyroidism: Secondary | ICD-10-CM | POA: Diagnosis not present

## 2019-01-17 DIAGNOSIS — M8589 Other specified disorders of bone density and structure, multiple sites: Secondary | ICD-10-CM | POA: Diagnosis not present

## 2019-02-01 ENCOUNTER — Other Ambulatory Visit: Payer: Self-pay | Admitting: Internal Medicine

## 2019-02-01 DIAGNOSIS — Z1231 Encounter for screening mammogram for malignant neoplasm of breast: Secondary | ICD-10-CM

## 2019-02-02 DIAGNOSIS — M65312 Trigger thumb, left thumb: Secondary | ICD-10-CM | POA: Diagnosis not present

## 2019-02-02 DIAGNOSIS — R768 Other specified abnormal immunological findings in serum: Secondary | ICD-10-CM | POA: Diagnosis not present

## 2019-02-02 DIAGNOSIS — G5601 Carpal tunnel syndrome, right upper limb: Secondary | ICD-10-CM | POA: Diagnosis not present

## 2019-02-02 DIAGNOSIS — M1712 Unilateral primary osteoarthritis, left knee: Secondary | ICD-10-CM | POA: Diagnosis not present

## 2019-02-02 DIAGNOSIS — M20021 Boutonniere deformity of right finger(s): Secondary | ICD-10-CM | POA: Diagnosis not present

## 2019-02-03 ENCOUNTER — Ambulatory Visit
Admission: RE | Admit: 2019-02-03 | Discharge: 2019-02-03 | Disposition: A | Discharge status: Home / Self Care | Payer: Medicare HMO | Source: Ambulatory Visit | Attending: Internal Medicine | Admitting: Internal Medicine

## 2019-02-03 DIAGNOSIS — Z1231 Encounter for screening mammogram for malignant neoplasm of breast: Secondary | ICD-10-CM | POA: Insufficient documentation

## 2019-02-17 DIAGNOSIS — N390 Urinary tract infection, site not specified: Secondary | ICD-10-CM | POA: Diagnosis not present

## 2019-02-17 DIAGNOSIS — R1031 Right lower quadrant pain: Secondary | ICD-10-CM | POA: Diagnosis not present

## 2019-02-17 DIAGNOSIS — R1032 Left lower quadrant pain: Secondary | ICD-10-CM | POA: Diagnosis not present

## 2019-02-17 DIAGNOSIS — Z87440 Personal history of urinary (tract) infections: Secondary | ICD-10-CM | POA: Diagnosis not present

## 2019-02-17 DIAGNOSIS — K219 Gastro-esophageal reflux disease without esophagitis: Secondary | ICD-10-CM | POA: Diagnosis not present

## 2019-02-17 DIAGNOSIS — K5900 Constipation, unspecified: Secondary | ICD-10-CM | POA: Diagnosis not present

## 2019-02-17 DIAGNOSIS — M545 Low back pain: Secondary | ICD-10-CM | POA: Diagnosis not present

## 2019-03-20 DIAGNOSIS — I10 Essential (primary) hypertension: Secondary | ICD-10-CM | POA: Diagnosis not present

## 2019-03-20 DIAGNOSIS — Z0000 Encounter for general adult medical examination without abnormal findings: Secondary | ICD-10-CM | POA: Diagnosis not present

## 2019-03-20 DIAGNOSIS — M545 Low back pain: Secondary | ICD-10-CM | POA: Diagnosis not present

## 2019-03-20 DIAGNOSIS — D649 Anemia, unspecified: Secondary | ICD-10-CM | POA: Diagnosis not present

## 2019-03-20 DIAGNOSIS — E785 Hyperlipidemia, unspecified: Secondary | ICD-10-CM | POA: Diagnosis not present

## 2019-03-20 DIAGNOSIS — F3340 Major depressive disorder, recurrent, in remission, unspecified: Secondary | ICD-10-CM | POA: Diagnosis not present

## 2019-03-20 DIAGNOSIS — E0500 Thyrotoxicosis with diffuse goiter without thyrotoxic crisis or storm: Secondary | ICD-10-CM | POA: Diagnosis not present

## 2019-03-24 DIAGNOSIS — M8588 Other specified disorders of bone density and structure, other site: Secondary | ICD-10-CM | POA: Diagnosis not present

## 2019-03-24 DIAGNOSIS — E0500 Thyrotoxicosis with diffuse goiter without thyrotoxic crisis or storm: Secondary | ICD-10-CM | POA: Diagnosis not present

## 2019-03-24 DIAGNOSIS — E210 Primary hyperparathyroidism: Secondary | ICD-10-CM | POA: Diagnosis not present

## 2019-03-24 DIAGNOSIS — M8589 Other specified disorders of bone density and structure, multiple sites: Secondary | ICD-10-CM | POA: Diagnosis not present

## 2019-03-31 DIAGNOSIS — E890 Postprocedural hypothyroidism: Secondary | ICD-10-CM | POA: Diagnosis not present

## 2019-03-31 DIAGNOSIS — E210 Primary hyperparathyroidism: Secondary | ICD-10-CM | POA: Diagnosis not present

## 2019-03-31 DIAGNOSIS — M8589 Other specified disorders of bone density and structure, multiple sites: Secondary | ICD-10-CM | POA: Diagnosis not present

## 2019-04-14 DIAGNOSIS — Z20822 Contact with and (suspected) exposure to covid-19: Secondary | ICD-10-CM | POA: Diagnosis not present

## 2019-04-14 DIAGNOSIS — Z20828 Contact with and (suspected) exposure to other viral communicable diseases: Secondary | ICD-10-CM | POA: Diagnosis not present

## 2019-06-21 DIAGNOSIS — H524 Presbyopia: Secondary | ICD-10-CM | POA: Diagnosis not present

## 2019-06-21 DIAGNOSIS — Z0100 Encounter for examination of eyes and vision without abnormal findings: Secondary | ICD-10-CM | POA: Diagnosis not present

## 2019-07-13 DIAGNOSIS — E890 Postprocedural hypothyroidism: Secondary | ICD-10-CM | POA: Diagnosis not present

## 2019-07-13 DIAGNOSIS — I10 Essential (primary) hypertension: Secondary | ICD-10-CM | POA: Diagnosis not present

## 2019-07-13 DIAGNOSIS — F3342 Major depressive disorder, recurrent, in full remission: Secondary | ICD-10-CM | POA: Diagnosis not present

## 2019-07-13 DIAGNOSIS — Z789 Other specified health status: Secondary | ICD-10-CM | POA: Diagnosis not present

## 2019-07-13 DIAGNOSIS — K219 Gastro-esophageal reflux disease without esophagitis: Secondary | ICD-10-CM | POA: Diagnosis not present

## 2019-07-13 DIAGNOSIS — R8290 Unspecified abnormal findings in urine: Secondary | ICD-10-CM | POA: Diagnosis not present

## 2019-07-13 DIAGNOSIS — E210 Primary hyperparathyroidism: Secondary | ICD-10-CM | POA: Diagnosis not present

## 2019-07-13 DIAGNOSIS — M545 Low back pain: Secondary | ICD-10-CM | POA: Diagnosis not present

## 2019-07-20 DIAGNOSIS — E785 Hyperlipidemia, unspecified: Secondary | ICD-10-CM | POA: Diagnosis not present

## 2019-07-20 DIAGNOSIS — I10 Essential (primary) hypertension: Secondary | ICD-10-CM | POA: Diagnosis not present

## 2019-07-20 DIAGNOSIS — M545 Low back pain: Secondary | ICD-10-CM | POA: Diagnosis not present

## 2019-07-20 DIAGNOSIS — K5900 Constipation, unspecified: Secondary | ICD-10-CM | POA: Diagnosis not present

## 2019-07-20 DIAGNOSIS — D649 Anemia, unspecified: Secondary | ICD-10-CM | POA: Diagnosis not present

## 2019-07-20 DIAGNOSIS — N898 Other specified noninflammatory disorders of vagina: Secondary | ICD-10-CM | POA: Diagnosis not present

## 2019-07-20 DIAGNOSIS — M79641 Pain in right hand: Secondary | ICD-10-CM | POA: Diagnosis not present

## 2019-07-20 DIAGNOSIS — E0500 Thyrotoxicosis with diffuse goiter without thyrotoxic crisis or storm: Secondary | ICD-10-CM | POA: Diagnosis not present

## 2019-07-21 DIAGNOSIS — E0500 Thyrotoxicosis with diffuse goiter without thyrotoxic crisis or storm: Secondary | ICD-10-CM | POA: Diagnosis not present

## 2019-07-21 DIAGNOSIS — E210 Primary hyperparathyroidism: Secondary | ICD-10-CM | POA: Diagnosis not present

## 2019-07-21 DIAGNOSIS — M8589 Other specified disorders of bone density and structure, multiple sites: Secondary | ICD-10-CM | POA: Diagnosis not present

## 2019-09-13 DIAGNOSIS — R739 Hyperglycemia, unspecified: Secondary | ICD-10-CM | POA: Diagnosis not present

## 2019-09-13 DIAGNOSIS — Z6832 Body mass index (BMI) 32.0-32.9, adult: Secondary | ICD-10-CM | POA: Diagnosis not present

## 2019-09-13 DIAGNOSIS — E0500 Thyrotoxicosis with diffuse goiter without thyrotoxic crisis or storm: Secondary | ICD-10-CM | POA: Diagnosis not present

## 2019-09-13 DIAGNOSIS — R8290 Unspecified abnormal findings in urine: Secondary | ICD-10-CM | POA: Diagnosis not present

## 2019-09-13 DIAGNOSIS — N898 Other specified noninflammatory disorders of vagina: Secondary | ICD-10-CM | POA: Diagnosis not present

## 2019-09-13 DIAGNOSIS — Z789 Other specified health status: Secondary | ICD-10-CM | POA: Diagnosis not present

## 2019-09-13 DIAGNOSIS — I10 Essential (primary) hypertension: Secondary | ICD-10-CM | POA: Diagnosis not present

## 2019-09-13 DIAGNOSIS — L309 Dermatitis, unspecified: Secondary | ICD-10-CM | POA: Diagnosis not present

## 2019-09-13 DIAGNOSIS — M1712 Unilateral primary osteoarthritis, left knee: Secondary | ICD-10-CM | POA: Diagnosis not present

## 2019-09-20 IMAGING — NM NUCLEAR MEDICINE THYROID UPTAKE (4 AND 24 HOUR) AND SCAN
1 series · 4 of 4 positions shown · non-contrast
Comparison: None

Correlation: Thyroid ultrasound 05/02/2014

CLINICAL DATA: Graves disease, TSH =

EXAM:
THYROID SCAN AND UPTAKE - 4 AND 24 HOURS
TECHNIQUE: Following oral administration of 3-XOG capsule, anterior planar
imaging was acquired at 24 hours. Thyroid uptake was calculated with
a thyroid probe at 4-6 hours and 24 hours.
RADIOPHARMACEUTICALS:  434.39 microcuries 3-XOG sodium iodide p.o.

[Series 1000: (id) thyroid scan · 2.40mm/px · 4 of 4 slices shown]
[im 1/4]
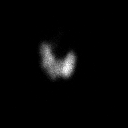
[im 2/4]
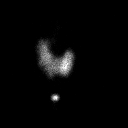
[im 3/4]
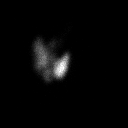
[im 4/4]
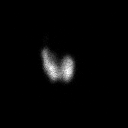

[4 of 4 positions shown; findings below may reference images not displayed]

FINDINGS: Increased tracer accumulation diffusely in both thyroid lobes.

Tiny cold nodule at periphery of mid inferior RIGHT lobe.

No focal areas of increased tracer accumulation.

4 hour 3-XOG uptake = 41.8% (normal 5-20%)

24 hour 3-XOG uptake = 81.7% (normal 10-30%)
IMPRESSION: Significantly elevated 4 hour and 24 hour radio iodine uptakes
consistent with hyperthyroidism.

In combination with imaging, findings are consistent with Graves
disease.

## 2019-10-20 DIAGNOSIS — K5730 Diverticulosis of large intestine without perforation or abscess without bleeding: Secondary | ICD-10-CM | POA: Diagnosis not present

## 2019-10-20 DIAGNOSIS — K219 Gastro-esophageal reflux disease without esophagitis: Secondary | ICD-10-CM | POA: Diagnosis not present

## 2019-10-20 DIAGNOSIS — E0500 Thyrotoxicosis with diffuse goiter without thyrotoxic crisis or storm: Secondary | ICD-10-CM | POA: Diagnosis not present

## 2019-10-20 DIAGNOSIS — K5909 Other constipation: Secondary | ICD-10-CM | POA: Diagnosis not present

## 2019-10-20 DIAGNOSIS — Z1211 Encounter for screening for malignant neoplasm of colon: Secondary | ICD-10-CM | POA: Diagnosis not present

## 2019-11-16 DIAGNOSIS — E0500 Thyrotoxicosis with diffuse goiter without thyrotoxic crisis or storm: Secondary | ICD-10-CM | POA: Diagnosis not present

## 2019-11-16 DIAGNOSIS — M1712 Unilateral primary osteoarthritis, left knee: Secondary | ICD-10-CM | POA: Diagnosis not present

## 2019-11-16 DIAGNOSIS — N898 Other specified noninflammatory disorders of vagina: Secondary | ICD-10-CM | POA: Diagnosis not present

## 2019-11-16 DIAGNOSIS — E210 Primary hyperparathyroidism: Secondary | ICD-10-CM | POA: Diagnosis not present

## 2019-11-16 DIAGNOSIS — I10 Essential (primary) hypertension: Secondary | ICD-10-CM | POA: Diagnosis not present

## 2019-11-16 DIAGNOSIS — R739 Hyperglycemia, unspecified: Secondary | ICD-10-CM | POA: Diagnosis not present

## 2019-11-16 DIAGNOSIS — Z789 Other specified health status: Secondary | ICD-10-CM | POA: Diagnosis not present

## 2019-11-16 DIAGNOSIS — Z6832 Body mass index (BMI) 32.0-32.9, adult: Secondary | ICD-10-CM | POA: Diagnosis not present

## 2019-11-16 DIAGNOSIS — M8589 Other specified disorders of bone density and structure, multiple sites: Secondary | ICD-10-CM | POA: Diagnosis not present

## 2019-11-17 DIAGNOSIS — R8290 Unspecified abnormal findings in urine: Secondary | ICD-10-CM | POA: Diagnosis not present

## 2019-11-23 ENCOUNTER — Other Ambulatory Visit: Payer: Self-pay | Admitting: Internal Medicine

## 2019-11-23 DIAGNOSIS — E785 Hyperlipidemia, unspecified: Secondary | ICD-10-CM | POA: Diagnosis not present

## 2019-11-23 DIAGNOSIS — Z789 Other specified health status: Secondary | ICD-10-CM | POA: Diagnosis not present

## 2019-11-23 DIAGNOSIS — Z79899 Other long term (current) drug therapy: Secondary | ICD-10-CM | POA: Diagnosis not present

## 2019-11-23 DIAGNOSIS — Z23 Encounter for immunization: Secondary | ICD-10-CM | POA: Diagnosis not present

## 2019-11-23 DIAGNOSIS — D649 Anemia, unspecified: Secondary | ICD-10-CM | POA: Diagnosis not present

## 2019-11-23 DIAGNOSIS — R7989 Other specified abnormal findings of blood chemistry: Secondary | ICD-10-CM | POA: Diagnosis not present

## 2019-11-23 DIAGNOSIS — I10 Essential (primary) hypertension: Secondary | ICD-10-CM | POA: Diagnosis not present

## 2019-11-23 DIAGNOSIS — Z1231 Encounter for screening mammogram for malignant neoplasm of breast: Secondary | ICD-10-CM

## 2019-11-23 DIAGNOSIS — E0500 Thyrotoxicosis with diffuse goiter without thyrotoxic crisis or storm: Secondary | ICD-10-CM | POA: Diagnosis not present

## 2019-11-23 DIAGNOSIS — F329 Major depressive disorder, single episode, unspecified: Secondary | ICD-10-CM | POA: Diagnosis not present

## 2019-11-24 DIAGNOSIS — E210 Primary hyperparathyroidism: Secondary | ICD-10-CM | POA: Diagnosis not present

## 2019-11-24 DIAGNOSIS — E0500 Thyrotoxicosis with diffuse goiter without thyrotoxic crisis or storm: Secondary | ICD-10-CM | POA: Diagnosis not present

## 2019-11-24 DIAGNOSIS — M8589 Other specified disorders of bone density and structure, multiple sites: Secondary | ICD-10-CM | POA: Diagnosis not present

## 2019-12-12 DIAGNOSIS — Z01818 Encounter for other preprocedural examination: Secondary | ICD-10-CM | POA: Diagnosis not present

## 2019-12-15 DIAGNOSIS — K5730 Diverticulosis of large intestine without perforation or abscess without bleeding: Secondary | ICD-10-CM | POA: Diagnosis not present

## 2019-12-15 DIAGNOSIS — Z1211 Encounter for screening for malignant neoplasm of colon: Secondary | ICD-10-CM | POA: Diagnosis not present

## 2019-12-15 DIAGNOSIS — F32A Depression, unspecified: Secondary | ICD-10-CM | POA: Diagnosis not present

## 2019-12-15 DIAGNOSIS — K635 Polyp of colon: Secondary | ICD-10-CM | POA: Diagnosis not present

## 2019-12-15 DIAGNOSIS — K640 First degree hemorrhoids: Secondary | ICD-10-CM | POA: Diagnosis not present

## 2019-12-15 DIAGNOSIS — D123 Benign neoplasm of transverse colon: Secondary | ICD-10-CM | POA: Diagnosis not present

## 2019-12-15 DIAGNOSIS — K6389 Other specified diseases of intestine: Secondary | ICD-10-CM | POA: Diagnosis not present

## 2019-12-15 DIAGNOSIS — K219 Gastro-esophageal reflux disease without esophagitis: Secondary | ICD-10-CM | POA: Diagnosis not present

## 2019-12-15 DIAGNOSIS — E119 Type 2 diabetes mellitus without complications: Secondary | ICD-10-CM | POA: Diagnosis not present

## 2020-01-16 DIAGNOSIS — R8290 Unspecified abnormal findings in urine: Secondary | ICD-10-CM | POA: Diagnosis not present

## 2020-01-30 DIAGNOSIS — E0500 Thyrotoxicosis with diffuse goiter without thyrotoxic crisis or storm: Secondary | ICD-10-CM | POA: Diagnosis not present

## 2020-02-05 DIAGNOSIS — E210 Primary hyperparathyroidism: Secondary | ICD-10-CM | POA: Diagnosis not present

## 2020-02-05 DIAGNOSIS — M8589 Other specified disorders of bone density and structure, multiple sites: Secondary | ICD-10-CM | POA: Diagnosis not present

## 2020-02-05 DIAGNOSIS — E0500 Thyrotoxicosis with diffuse goiter without thyrotoxic crisis or storm: Secondary | ICD-10-CM | POA: Diagnosis not present

## 2020-03-12 ENCOUNTER — Other Ambulatory Visit: Payer: Self-pay

## 2020-03-12 ENCOUNTER — Ambulatory Visit
Admission: RE | Admit: 2020-03-12 | Discharge: 2020-03-12 | Disposition: A | Discharge status: Home / Self Care | Payer: Medicare HMO | Source: Ambulatory Visit | Attending: Internal Medicine | Admitting: Internal Medicine

## 2020-03-12 DIAGNOSIS — Z1231 Encounter for screening mammogram for malignant neoplasm of breast: Secondary | ICD-10-CM | POA: Diagnosis not present

## 2020-03-28 DIAGNOSIS — E0500 Thyrotoxicosis with diffuse goiter without thyrotoxic crisis or storm: Secondary | ICD-10-CM | POA: Diagnosis not present

## 2020-03-28 DIAGNOSIS — D508 Other iron deficiency anemias: Secondary | ICD-10-CM | POA: Diagnosis not present

## 2020-03-28 DIAGNOSIS — Z789 Other specified health status: Secondary | ICD-10-CM | POA: Diagnosis not present

## 2020-03-28 DIAGNOSIS — E210 Primary hyperparathyroidism: Secondary | ICD-10-CM | POA: Diagnosis not present

## 2020-03-28 DIAGNOSIS — I10 Essential (primary) hypertension: Secondary | ICD-10-CM | POA: Diagnosis not present

## 2020-03-28 DIAGNOSIS — E7800 Pure hypercholesterolemia, unspecified: Secondary | ICD-10-CM | POA: Diagnosis not present

## 2020-03-28 DIAGNOSIS — Z6831 Body mass index (BMI) 31.0-31.9, adult: Secondary | ICD-10-CM | POA: Diagnosis not present

## 2020-03-28 DIAGNOSIS — F3342 Major depressive disorder, recurrent, in full remission: Secondary | ICD-10-CM | POA: Diagnosis not present

## 2020-03-28 DIAGNOSIS — R7989 Other specified abnormal findings of blood chemistry: Secondary | ICD-10-CM | POA: Diagnosis not present

## 2020-04-04 DIAGNOSIS — Z789 Other specified health status: Secondary | ICD-10-CM | POA: Diagnosis not present

## 2020-04-04 DIAGNOSIS — R058 Other specified cough: Secondary | ICD-10-CM | POA: Diagnosis not present

## 2020-04-04 DIAGNOSIS — R059 Cough, unspecified: Secondary | ICD-10-CM | POA: Diagnosis not present

## 2020-04-04 DIAGNOSIS — R0989 Other specified symptoms and signs involving the circulatory and respiratory systems: Secondary | ICD-10-CM | POA: Diagnosis not present

## 2020-04-04 DIAGNOSIS — I10 Essential (primary) hypertension: Secondary | ICD-10-CM | POA: Diagnosis not present

## 2020-04-04 DIAGNOSIS — E0500 Thyrotoxicosis with diffuse goiter without thyrotoxic crisis or storm: Secondary | ICD-10-CM | POA: Diagnosis not present

## 2020-04-04 DIAGNOSIS — Z0000 Encounter for general adult medical examination without abnormal findings: Secondary | ICD-10-CM | POA: Diagnosis not present

## 2020-04-04 DIAGNOSIS — J45991 Cough variant asthma: Secondary | ICD-10-CM | POA: Diagnosis not present

## 2020-04-04 DIAGNOSIS — F325 Major depressive disorder, single episode, in full remission: Secondary | ICD-10-CM | POA: Diagnosis not present

## 2020-04-24 DIAGNOSIS — M25512 Pain in left shoulder: Secondary | ICD-10-CM | POA: Diagnosis not present

## 2020-04-24 DIAGNOSIS — W010XXA Fall on same level from slipping, tripping and stumbling without subsequent striking against object, initial encounter: Secondary | ICD-10-CM | POA: Diagnosis not present

## 2020-04-24 DIAGNOSIS — M2578 Osteophyte, vertebrae: Secondary | ICD-10-CM | POA: Diagnosis not present

## 2020-04-24 DIAGNOSIS — M47812 Spondylosis without myelopathy or radiculopathy, cervical region: Secondary | ICD-10-CM | POA: Diagnosis not present

## 2020-04-24 DIAGNOSIS — M542 Cervicalgia: Secondary | ICD-10-CM | POA: Diagnosis not present

## 2020-04-24 DIAGNOSIS — Y92009 Unspecified place in unspecified non-institutional (private) residence as the place of occurrence of the external cause: Secondary | ICD-10-CM | POA: Diagnosis not present

## 2020-05-28 DIAGNOSIS — M8589 Other specified disorders of bone density and structure, multiple sites: Secondary | ICD-10-CM | POA: Diagnosis not present

## 2020-05-28 DIAGNOSIS — E0500 Thyrotoxicosis with diffuse goiter without thyrotoxic crisis or storm: Secondary | ICD-10-CM | POA: Diagnosis not present

## 2020-05-28 DIAGNOSIS — E210 Primary hyperparathyroidism: Secondary | ICD-10-CM | POA: Diagnosis not present

## 2020-07-30 DIAGNOSIS — I10 Essential (primary) hypertension: Secondary | ICD-10-CM | POA: Diagnosis not present

## 2020-07-30 DIAGNOSIS — E0500 Thyrotoxicosis with diffuse goiter without thyrotoxic crisis or storm: Secondary | ICD-10-CM | POA: Diagnosis not present

## 2020-07-30 DIAGNOSIS — R7309 Other abnormal glucose: Secondary | ICD-10-CM | POA: Diagnosis not present

## 2020-07-30 DIAGNOSIS — R7989 Other specified abnormal findings of blood chemistry: Secondary | ICD-10-CM | POA: Diagnosis not present

## 2020-07-30 DIAGNOSIS — F3342 Major depressive disorder, recurrent, in full remission: Secondary | ICD-10-CM | POA: Diagnosis not present

## 2020-07-30 DIAGNOSIS — R8290 Unspecified abnormal findings in urine: Secondary | ICD-10-CM | POA: Diagnosis not present

## 2020-07-30 DIAGNOSIS — Z79899 Other long term (current) drug therapy: Secondary | ICD-10-CM | POA: Diagnosis not present

## 2020-07-30 DIAGNOSIS — Z0000 Encounter for general adult medical examination without abnormal findings: Secondary | ICD-10-CM | POA: Diagnosis not present

## 2020-07-30 DIAGNOSIS — E210 Primary hyperparathyroidism: Secondary | ICD-10-CM | POA: Diagnosis not present

## 2020-08-22 DIAGNOSIS — Z0000 Encounter for general adult medical examination without abnormal findings: Secondary | ICD-10-CM | POA: Diagnosis not present

## 2020-08-22 DIAGNOSIS — J45991 Cough variant asthma: Secondary | ICD-10-CM | POA: Diagnosis not present

## 2020-08-22 DIAGNOSIS — F325 Major depressive disorder, single episode, in full remission: Secondary | ICD-10-CM | POA: Diagnosis not present

## 2020-08-22 DIAGNOSIS — I10 Essential (primary) hypertension: Secondary | ICD-10-CM | POA: Diagnosis not present

## 2020-08-22 DIAGNOSIS — Z789 Other specified health status: Secondary | ICD-10-CM | POA: Diagnosis not present

## 2020-08-22 DIAGNOSIS — E119 Type 2 diabetes mellitus without complications: Secondary | ICD-10-CM | POA: Diagnosis not present

## 2020-08-22 DIAGNOSIS — E0500 Thyrotoxicosis with diffuse goiter without thyrotoxic crisis or storm: Secondary | ICD-10-CM | POA: Diagnosis not present

## 2020-11-26 DIAGNOSIS — E213 Hyperparathyroidism, unspecified: Secondary | ICD-10-CM | POA: Diagnosis not present

## 2020-11-26 DIAGNOSIS — E0500 Thyrotoxicosis with diffuse goiter without thyrotoxic crisis or storm: Secondary | ICD-10-CM | POA: Diagnosis not present

## 2020-12-03 DIAGNOSIS — M8589 Other specified disorders of bone density and structure, multiple sites: Secondary | ICD-10-CM | POA: Diagnosis not present

## 2020-12-03 DIAGNOSIS — E213 Hyperparathyroidism, unspecified: Secondary | ICD-10-CM | POA: Diagnosis not present

## 2020-12-03 DIAGNOSIS — E0500 Thyrotoxicosis with diffuse goiter without thyrotoxic crisis or storm: Secondary | ICD-10-CM | POA: Diagnosis not present

## 2020-12-16 DIAGNOSIS — E210 Primary hyperparathyroidism: Secondary | ICD-10-CM | POA: Diagnosis not present

## 2020-12-16 DIAGNOSIS — I10 Essential (primary) hypertension: Secondary | ICD-10-CM | POA: Diagnosis not present

## 2020-12-16 DIAGNOSIS — F3342 Major depressive disorder, recurrent, in full remission: Secondary | ICD-10-CM | POA: Diagnosis not present

## 2020-12-16 DIAGNOSIS — E0500 Thyrotoxicosis with diffuse goiter without thyrotoxic crisis or storm: Secondary | ICD-10-CM | POA: Diagnosis not present

## 2020-12-16 DIAGNOSIS — Z789 Other specified health status: Secondary | ICD-10-CM | POA: Diagnosis not present

## 2020-12-16 DIAGNOSIS — R7309 Other abnormal glucose: Secondary | ICD-10-CM | POA: Diagnosis not present

## 2020-12-16 DIAGNOSIS — J45991 Cough variant asthma: Secondary | ICD-10-CM | POA: Diagnosis not present

## 2020-12-23 DIAGNOSIS — M542 Cervicalgia: Secondary | ICD-10-CM | POA: Diagnosis not present

## 2020-12-23 DIAGNOSIS — Z789 Other specified health status: Secondary | ICD-10-CM | POA: Diagnosis not present

## 2020-12-23 DIAGNOSIS — E119 Type 2 diabetes mellitus without complications: Secondary | ICD-10-CM | POA: Diagnosis not present

## 2020-12-23 DIAGNOSIS — F325 Major depressive disorder, single episode, in full remission: Secondary | ICD-10-CM | POA: Diagnosis not present

## 2020-12-23 DIAGNOSIS — I10 Essential (primary) hypertension: Secondary | ICD-10-CM | POA: Diagnosis not present

## 2021-02-19 ENCOUNTER — Other Ambulatory Visit: Payer: Self-pay | Admitting: Internal Medicine

## 2021-02-19 DIAGNOSIS — Z1231 Encounter for screening mammogram for malignant neoplasm of breast: Secondary | ICD-10-CM

## 2021-03-20 ENCOUNTER — Other Ambulatory Visit: Payer: Self-pay

## 2021-03-20 ENCOUNTER — Ambulatory Visit
Admission: RE | Admit: 2021-03-20 | Discharge: 2021-03-20 | Disposition: A | Discharge status: Home / Self Care | Payer: Medicare HMO | Source: Ambulatory Visit | Attending: Internal Medicine | Admitting: Internal Medicine

## 2021-03-20 DIAGNOSIS — Z1231 Encounter for screening mammogram for malignant neoplasm of breast: Secondary | ICD-10-CM | POA: Insufficient documentation

## 2021-03-24 IMAGING — MG MM DIGITAL SCREENING BILAT W/ TOMO AND CAD
6 of 10 series · 6 of 30 positions shown · non-contrast
Comparison: Previous exam(s).

CLINICAL DATA: Screening.

EXAM:
DIGITAL SCREENING BILATERAL MAMMOGRAM WITH TOMOSYNTHESIS AND CAD
TECHNIQUE: Bilateral screening digital craniocaudal and mediolateral oblique
mammograms were obtained. Bilateral screening digital breast
tomosynthesis was performed. The images were evaluated with
computer-aided detection.

[R MLO synth-2D (1 of 2)]
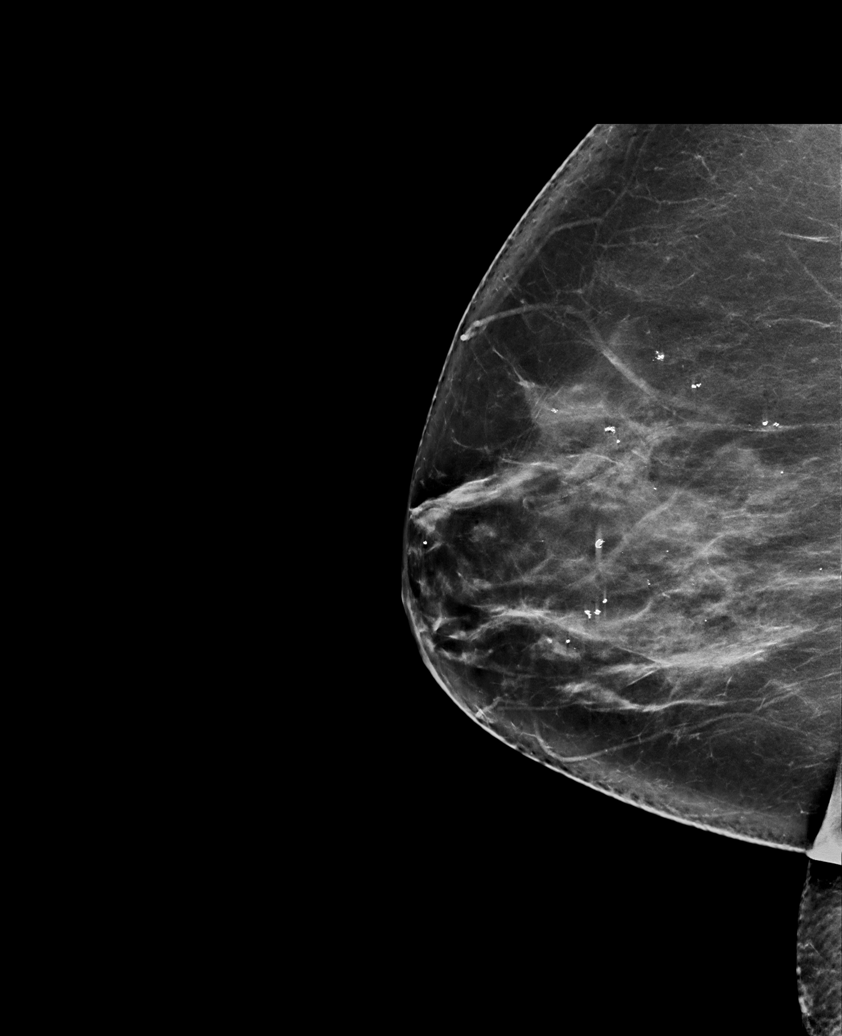

[R CC synth-2D]
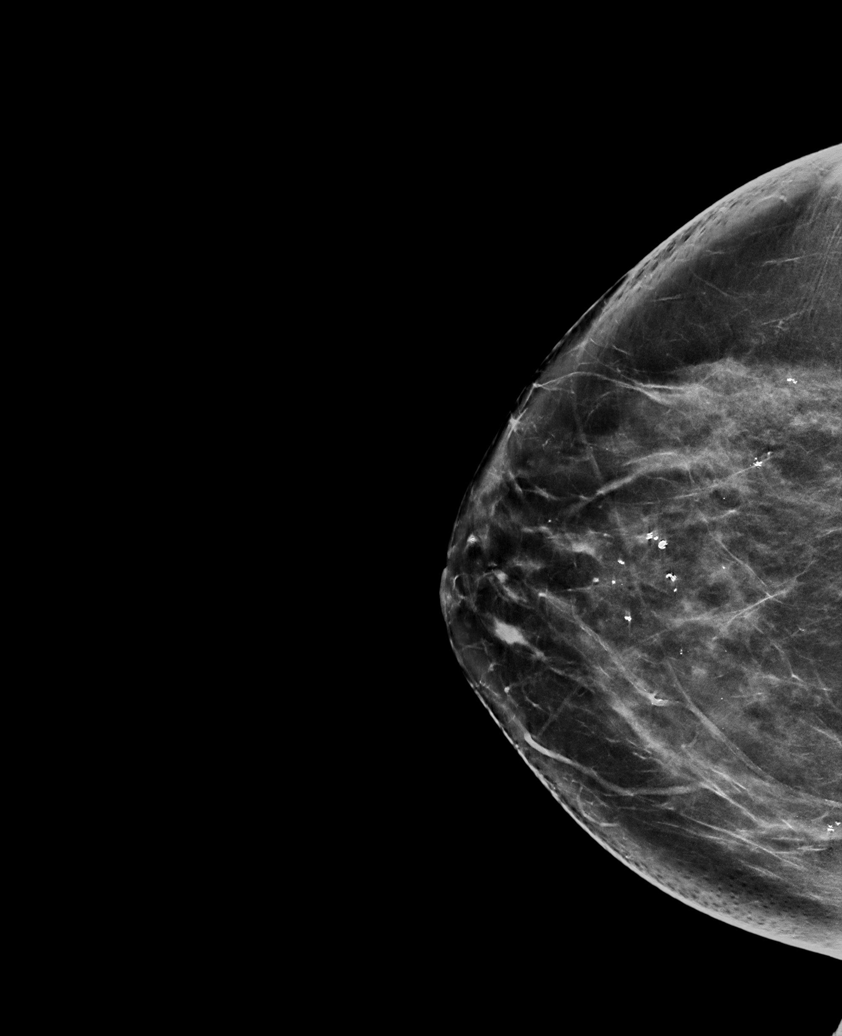

[R MLO synth-2D (2 of 2)]
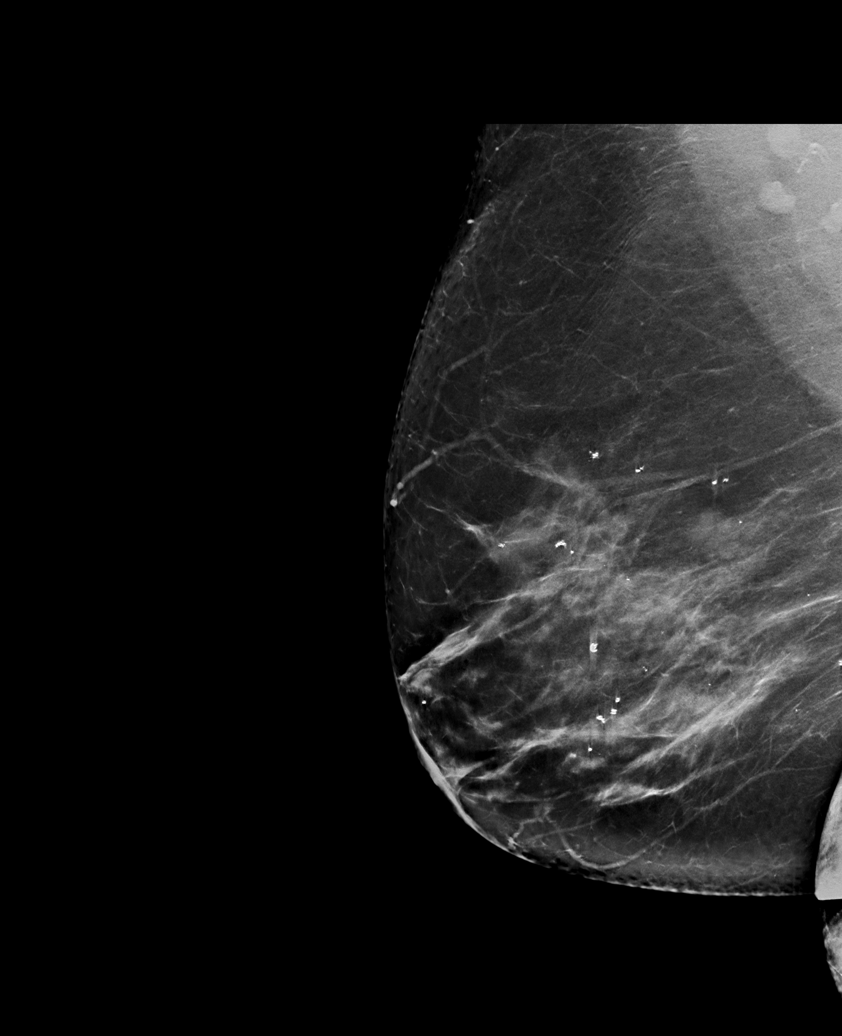

[L CC synth-2D]
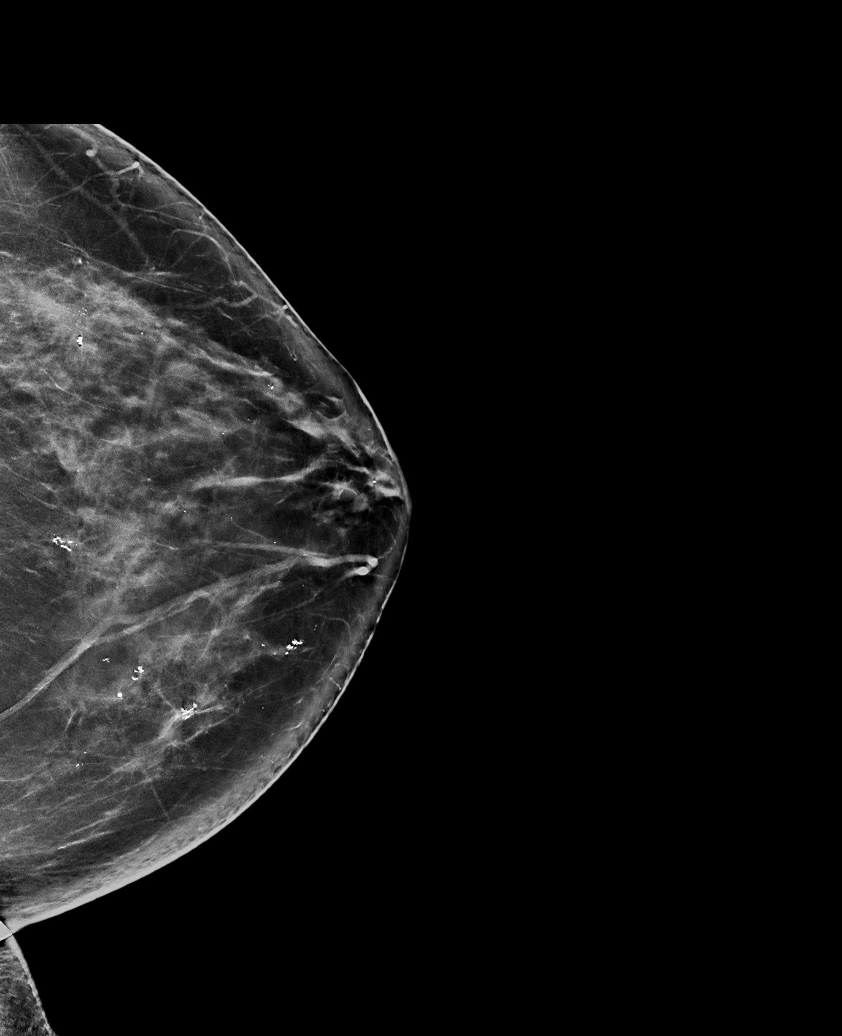

[L MLO synth-2D]
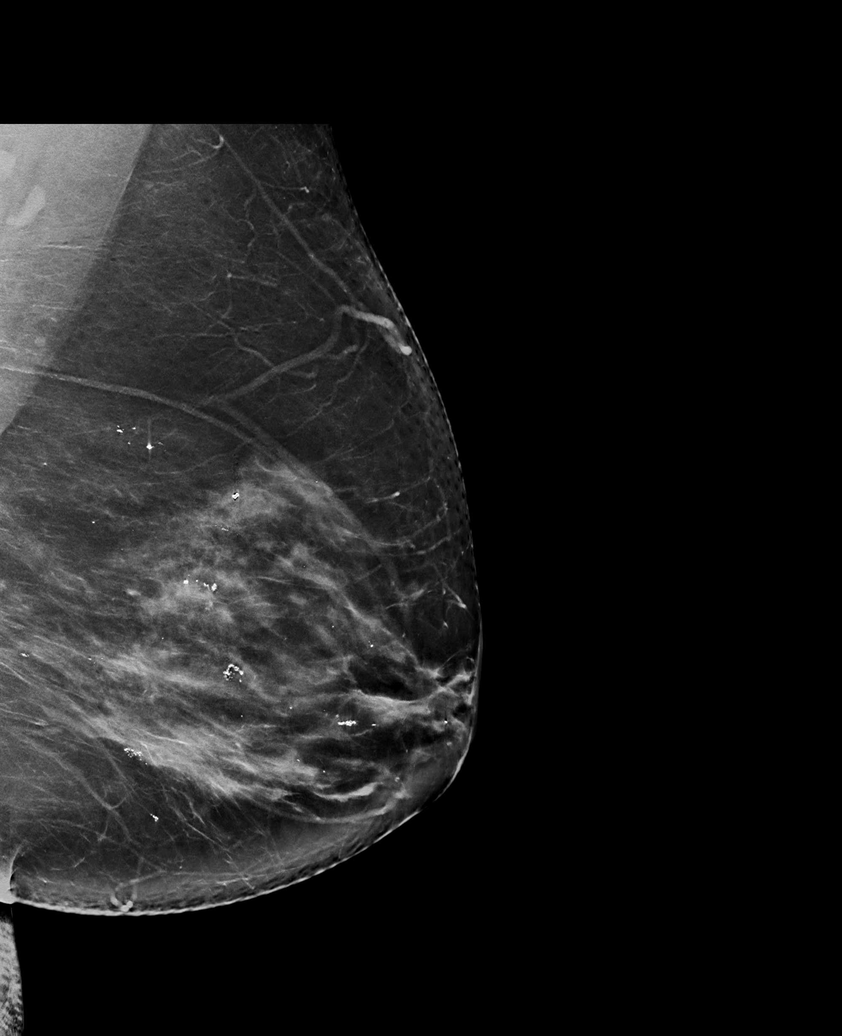

[L MLO tomo · tomo slice 47/93.0]
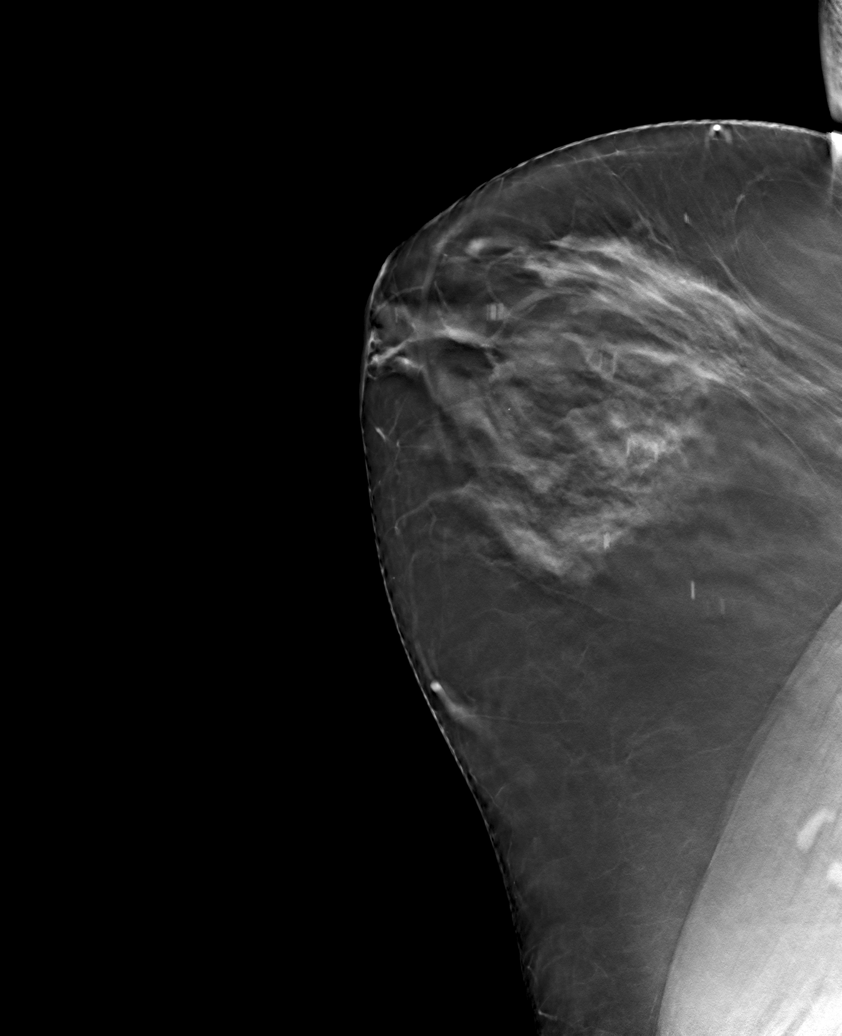

[6 of 30 positions shown; findings below may reference images not displayed]

ACR Breast Density Category c: The breast tissue is heterogeneously
dense, which may obscure small masses.
FINDINGS: There are no findings suspicious for malignancy. The images were
evaluated with computer-aided detection.
IMPRESSION: No mammographic evidence of malignancy. A result letter of this
screening mammogram will be mailed directly to the patient.

RECOMMENDATION:
Screening mammogram in one year. (Code:T4-5-GWO)

BI-RADS CATEGORY  1: Negative.

## 2021-03-31 DIAGNOSIS — E213 Hyperparathyroidism, unspecified: Secondary | ICD-10-CM | POA: Diagnosis not present

## 2021-03-31 DIAGNOSIS — M8588 Other specified disorders of bone density and structure, other site: Secondary | ICD-10-CM | POA: Diagnosis not present

## 2021-03-31 DIAGNOSIS — M8589 Other specified disorders of bone density and structure, multiple sites: Secondary | ICD-10-CM | POA: Diagnosis not present

## 2021-03-31 DIAGNOSIS — E0500 Thyrotoxicosis with diffuse goiter without thyrotoxic crisis or storm: Secondary | ICD-10-CM | POA: Diagnosis not present

## 2021-04-07 DIAGNOSIS — M8589 Other specified disorders of bone density and structure, multiple sites: Secondary | ICD-10-CM | POA: Diagnosis not present

## 2021-04-07 DIAGNOSIS — E213 Hyperparathyroidism, unspecified: Secondary | ICD-10-CM | POA: Diagnosis not present

## 2021-04-17 DIAGNOSIS — Z789 Other specified health status: Secondary | ICD-10-CM | POA: Diagnosis not present

## 2021-04-17 DIAGNOSIS — M542 Cervicalgia: Secondary | ICD-10-CM | POA: Diagnosis not present

## 2021-04-17 DIAGNOSIS — I10 Essential (primary) hypertension: Secondary | ICD-10-CM | POA: Diagnosis not present

## 2021-04-17 DIAGNOSIS — F3342 Major depressive disorder, recurrent, in full remission: Secondary | ICD-10-CM | POA: Diagnosis not present

## 2021-04-17 DIAGNOSIS — R8290 Unspecified abnormal findings in urine: Secondary | ICD-10-CM | POA: Diagnosis not present

## 2021-04-17 DIAGNOSIS — M1711 Unilateral primary osteoarthritis, right knee: Secondary | ICD-10-CM | POA: Diagnosis not present

## 2021-04-17 DIAGNOSIS — E1165 Type 2 diabetes mellitus with hyperglycemia: Secondary | ICD-10-CM | POA: Diagnosis not present

## 2021-04-24 DIAGNOSIS — I10 Essential (primary) hypertension: Secondary | ICD-10-CM | POA: Diagnosis not present

## 2021-04-24 DIAGNOSIS — E0500 Thyrotoxicosis with diffuse goiter without thyrotoxic crisis or storm: Secondary | ICD-10-CM | POA: Diagnosis not present

## 2021-04-24 DIAGNOSIS — E119 Type 2 diabetes mellitus without complications: Secondary | ICD-10-CM | POA: Diagnosis not present

## 2021-04-24 DIAGNOSIS — Z789 Other specified health status: Secondary | ICD-10-CM | POA: Diagnosis not present

## 2021-04-24 DIAGNOSIS — Z0000 Encounter for general adult medical examination without abnormal findings: Secondary | ICD-10-CM | POA: Diagnosis not present

## 2021-04-24 DIAGNOSIS — F325 Major depressive disorder, single episode, in full remission: Secondary | ICD-10-CM | POA: Diagnosis not present

## 2021-04-24 DIAGNOSIS — Z6832 Body mass index (BMI) 32.0-32.9, adult: Secondary | ICD-10-CM | POA: Diagnosis not present

## 2021-08-19 DIAGNOSIS — I10 Essential (primary) hypertension: Secondary | ICD-10-CM | POA: Diagnosis not present

## 2021-08-19 DIAGNOSIS — F3342 Major depressive disorder, recurrent, in full remission: Secondary | ICD-10-CM | POA: Diagnosis not present

## 2021-08-19 DIAGNOSIS — Z789 Other specified health status: Secondary | ICD-10-CM | POA: Diagnosis not present

## 2021-08-19 DIAGNOSIS — E0500 Thyrotoxicosis with diffuse goiter without thyrotoxic crisis or storm: Secondary | ICD-10-CM | POA: Diagnosis not present

## 2021-08-19 DIAGNOSIS — E1165 Type 2 diabetes mellitus with hyperglycemia: Secondary | ICD-10-CM | POA: Diagnosis not present

## 2021-08-19 DIAGNOSIS — M1711 Unilateral primary osteoarthritis, right knee: Secondary | ICD-10-CM | POA: Diagnosis not present

## 2021-08-26 DIAGNOSIS — Z789 Other specified health status: Secondary | ICD-10-CM | POA: Diagnosis not present

## 2021-08-26 DIAGNOSIS — E119 Type 2 diabetes mellitus without complications: Secondary | ICD-10-CM | POA: Diagnosis not present

## 2021-08-26 DIAGNOSIS — E0500 Thyrotoxicosis with diffuse goiter without thyrotoxic crisis or storm: Secondary | ICD-10-CM | POA: Diagnosis not present

## 2021-08-26 DIAGNOSIS — D649 Anemia, unspecified: Secondary | ICD-10-CM | POA: Diagnosis not present

## 2021-08-26 DIAGNOSIS — F325 Major depressive disorder, single episode, in full remission: Secondary | ICD-10-CM | POA: Diagnosis not present

## 2021-08-26 DIAGNOSIS — I10 Essential (primary) hypertension: Secondary | ICD-10-CM | POA: Diagnosis not present

## 2021-09-17 DIAGNOSIS — J019 Acute sinusitis, unspecified: Secondary | ICD-10-CM | POA: Diagnosis not present

## 2021-09-17 DIAGNOSIS — H9313 Tinnitus, bilateral: Secondary | ICD-10-CM | POA: Diagnosis not present

## 2021-09-17 DIAGNOSIS — J329 Chronic sinusitis, unspecified: Secondary | ICD-10-CM | POA: Diagnosis not present

## 2021-09-17 DIAGNOSIS — R070 Pain in throat: Secondary | ICD-10-CM | POA: Diagnosis not present

## 2021-09-17 DIAGNOSIS — J301 Allergic rhinitis due to pollen: Secondary | ICD-10-CM | POA: Diagnosis not present

## 2021-09-17 DIAGNOSIS — H6063 Unspecified chronic otitis externa, bilateral: Secondary | ICD-10-CM | POA: Diagnosis not present

## 2021-09-17 DIAGNOSIS — H6123 Impacted cerumen, bilateral: Secondary | ICD-10-CM | POA: Diagnosis not present

## 2021-09-18 ENCOUNTER — Other Ambulatory Visit: Payer: Self-pay | Admitting: Otolaryngology

## 2021-09-18 DIAGNOSIS — H93A2 Pulsatile tinnitus, left ear: Secondary | ICD-10-CM

## 2021-09-18 DIAGNOSIS — R42 Dizziness and giddiness: Secondary | ICD-10-CM

## 2021-10-02 ENCOUNTER — Ambulatory Visit
Admission: RE | Admit: 2021-10-02 | Discharge: 2021-10-02 | Disposition: A | Discharge status: Home / Self Care | Payer: Medicare HMO | Source: Ambulatory Visit | Attending: Otolaryngology | Admitting: Otolaryngology

## 2021-10-02 DIAGNOSIS — H93A2 Pulsatile tinnitus, left ear: Secondary | ICD-10-CM

## 2021-10-02 DIAGNOSIS — H93A1 Pulsatile tinnitus, right ear: Secondary | ICD-10-CM | POA: Diagnosis not present

## 2021-10-02 DIAGNOSIS — R42 Dizziness and giddiness: Secondary | ICD-10-CM | POA: Diagnosis not present

## 2021-10-02 MED ORDER — GADOBENATE DIMEGLUMINE 529 MG/ML IV SOLN
19.0000 mL | Freq: Once | INTRAVENOUS | Status: AC | PRN
  Administered 2021-10-02: 19 mL via INTRAVENOUS

## 2022-01-20 DIAGNOSIS — E1165 Type 2 diabetes mellitus with hyperglycemia: Secondary | ICD-10-CM | POA: Diagnosis not present

## 2022-01-20 DIAGNOSIS — Z79899 Other long term (current) drug therapy: Secondary | ICD-10-CM | POA: Diagnosis not present

## 2022-01-20 DIAGNOSIS — E0500 Thyrotoxicosis with diffuse goiter without thyrotoxic crisis or storm: Secondary | ICD-10-CM | POA: Diagnosis not present

## 2022-01-20 DIAGNOSIS — F3342 Major depressive disorder, recurrent, in full remission: Secondary | ICD-10-CM | POA: Diagnosis not present

## 2022-01-20 DIAGNOSIS — I10 Essential (primary) hypertension: Secondary | ICD-10-CM | POA: Diagnosis not present

## 2022-01-20 DIAGNOSIS — R8290 Unspecified abnormal findings in urine: Secondary | ICD-10-CM | POA: Diagnosis not present

## 2022-01-20 DIAGNOSIS — Z789 Other specified health status: Secondary | ICD-10-CM | POA: Diagnosis not present

## 2022-01-20 DIAGNOSIS — D649 Anemia, unspecified: Secondary | ICD-10-CM | POA: Diagnosis not present

## 2022-01-20 DIAGNOSIS — R7309 Other abnormal glucose: Secondary | ICD-10-CM | POA: Diagnosis not present

## 2022-01-27 DIAGNOSIS — E113393 Type 2 diabetes mellitus with moderate nonproliferative diabetic retinopathy without macular edema, bilateral: Secondary | ICD-10-CM | POA: Diagnosis not present

## 2022-01-27 DIAGNOSIS — H524 Presbyopia: Secondary | ICD-10-CM | POA: Diagnosis not present

## 2022-01-27 DIAGNOSIS — E119 Type 2 diabetes mellitus without complications: Secondary | ICD-10-CM | POA: Diagnosis not present

## 2022-01-27 DIAGNOSIS — J45991 Cough variant asthma: Secondary | ICD-10-CM | POA: Diagnosis not present

## 2022-01-27 DIAGNOSIS — N390 Urinary tract infection, site not specified: Secondary | ICD-10-CM | POA: Diagnosis not present

## 2022-01-27 DIAGNOSIS — E0500 Thyrotoxicosis with diffuse goiter without thyrotoxic crisis or storm: Secondary | ICD-10-CM | POA: Diagnosis not present

## 2022-01-27 DIAGNOSIS — Z6832 Body mass index (BMI) 32.0-32.9, adult: Secondary | ICD-10-CM | POA: Diagnosis not present

## 2022-01-27 DIAGNOSIS — F325 Major depressive disorder, single episode, in full remission: Secondary | ICD-10-CM | POA: Diagnosis not present

## 2022-01-27 DIAGNOSIS — I10 Essential (primary) hypertension: Secondary | ICD-10-CM | POA: Diagnosis not present

## 2022-02-25 ENCOUNTER — Other Ambulatory Visit: Payer: Self-pay | Admitting: Internal Medicine

## 2022-02-25 DIAGNOSIS — Z1231 Encounter for screening mammogram for malignant neoplasm of breast: Secondary | ICD-10-CM

## 2022-03-10 ENCOUNTER — Ambulatory Visit: Payer: Medicare PPO | Admitting: Urology

## 2022-03-10 VITALS — BP 137/86 | HR 106 | Ht 68.0 in | Wt 216.0 lb

## 2022-03-10 DIAGNOSIS — R8290 Unspecified abnormal findings in urine: Secondary | ICD-10-CM

## 2022-03-10 DIAGNOSIS — N898 Other specified noninflammatory disorders of vagina: Secondary | ICD-10-CM

## 2022-03-10 DIAGNOSIS — N390 Urinary tract infection, site not specified: Secondary | ICD-10-CM

## 2022-03-10 DIAGNOSIS — R82998 Other abnormal findings in urine: Secondary | ICD-10-CM

## 2022-03-10 LAB — URINALYSIS, COMPLETE
Bilirubin, UA: NEGATIVE
Glucose, UA: NEGATIVE
Ketones, UA: NEGATIVE
Nitrite, UA: NEGATIVE
Protein,UA: NEGATIVE
RBC, UA: NEGATIVE
Specific Gravity, UA: 1.015 (ref 1.005–1.030)
Urobilinogen, Ur: 0.2 mg/dL (ref 0.2–1.0)
pH, UA: 6.5 (ref 5.0–7.5)

## 2022-03-10 LAB — MICROSCOPIC EXAMINATION: Epithelial Cells (non renal): >10 /hpf — AB (ref 0–10)

## 2022-03-10 LAB — BLADDER SCAN AMB NON-IMAGING: Scan Result: 0

## 2022-03-10 NOTE — Patient Instructions (Signed)
For UTI prevention take cranberry tablets, probiotic, D-Mannose daily.

## 2022-03-10 NOTE — Progress Notes (Signed)
I,Amy Fritz,acting as a scribe for Hollice Espy, MD.,have documented all relevant documentation on the behalf of Hollice Espy, MD,as directed by  Hollice Espy, MD while in the presence of Hollice Espy, MD.  [id number] [phone] PM   Amy Fritz Despina Hidden [id number]  Referring provider: Tracie Harrier, MD [address] [facility] [place],  [place] [address]  Chief Complaint  Patient presents with   Establish Care   Recurrent UTI    HPI: 77 year-old female referred by Dr. Ginette Pitman further evaluation of recurrent UTI's.  She had labs and UA on 01/20/2022. The urine on that day showed 11 white blood cells, otherwise unremarkable. A culture was sent which grew Klebsiella resistant to Ampicillin, but otherwise pan-sensitive. No other documented urinary tract infections in the past calendar year. Prior to that she had an E. coli infection in 2022.   She doesn't have any recent upper tract imaging. Her last cross-sectional imaging of her kidneys in 2016 showed a small ureteral calculus but no significant upper tract stone burden.  She reports thinking  she has frequent UTI's because of having a foul ammonia smell to her urine along with vaginal itching. When she had these symptoms she called the doctor and was prescribed an anti-biotic over the phone, but didn't go in for a UA. She denies urgency, burning, fevers, and pain. She reports frequency which may be related to drinking a lot of water.   PVR 0 cc  Results for orders placed or performed in visit on 03/10/22  Microscopic Examination   Urine  Result Value Ref Range   WBC, UA 6-10 (A) 0 - 5 /hpf   RBC, Urine 0-2 0 - 2 /hpf   Epithelial Cells (non renal) >10 (A) 0 - 10 /hpf   Renal Epithel, UA 0-10 (A) None seen /hpf   Casts Present (A) None seen /lpf   Cast Type Hyaline casts N/A   Mucus, UA Present (A) Not Estab.   Bacteria, UA Moderate (A) None seen/Few  Urinalysis, Complete  Result Value Ref Range    Specific Gravity, UA 1.015 1.005 - 1.030   pH, UA 6.5 5.0 - 7.5   Color, UA Yellow Yellow   Appearance Ur Hazy (A) Clear   Leukocytes,UA 1+ (A) Negative   Protein,UA Negative Negative/Trace   Glucose, UA Negative Negative   Ketones, UA Negative Negative   RBC, UA Negative Negative   Bilirubin, UA Negative Negative   Urobilinogen, Ur 0.2 0.2 - 1.0 mg/dL   Nitrite, UA Negative Negative   Microscopic Examination See below:   Bladder Scan (Post Void Residual) in office  Result Value Ref Range   Scan Result 0 ml      PMH: Past Medical History:  Diagnosis Date   Hypertension     Surgical History: Past Surgical History:  Procedure Laterality Date   BREAST BIOPSY Left 10/12/2011   Fibrocystic changes associated with calcs   BREAST CYST EXCISION Right    BREAST EXCISIONAL BIOPSY Right     neg   BREAST EXCISIONAL BIOPSY Right    neg    Home Medications:  Allergies as of 03/10/2022       Reactions   Ace Inhibitors Other (See Comments)   Amlodipine Other (See Comments)   Doxazosin Other (See Comments)   Hydrocodone-acetaminophen Other (See Comments)   Metronidazole Other (See Comments)   Percocet [oxycodone-acetaminophen] Other (See Comments)   Sulfa Antibiotics Other (See Comments)  Medication List        Accurate as of March 10, 2022 12:33 PM. If you have any questions, ask your nurse or doctor.          Advair HFA 230-21 MCG/ACT inhaler Generic drug: fluticasone-salmeterol Inhale 2 puffs into the lungs 2 (two) times daily.   cyanocobalamin 1000 MCG tablet Commonly known as: VITAMIN B12 Take 1,000 mcg by mouth daily.   docusate sodium 100 MG capsule Commonly known as: COLACE Take 100 mg by mouth 2 (two) times daily.   ezetimibe 10 MG tablet Commonly known as: ZETIA Take 10 mg by mouth daily.   hydrochlorothiazide 12.5 MG capsule Commonly known as: MICROZIDE Take 12.5 mg by mouth daily.   loratadine 10 MG tablet Commonly known as:  CLARITIN Take 1 tablet by mouth daily.   losartan 50 MG tablet Commonly known as: COZAAR Take 50 mg by mouth daily.   magnesium oxide 400 MG tablet Commonly known as: MAG-OX Take 400 mg by mouth daily.   metFORMIN 500 MG tablet Commonly known as: GLUCOPHAGE Take 500 mg by mouth 2 (two) times daily.   montelukast 10 MG tablet Commonly known as: SINGULAIR Take 10 mg by mouth daily.   omeprazole 20 MG capsule Commonly known as: PRILOSEC Take 20 mg by mouth 2 (two) times daily.   venlafaxine XR 75 MG 24 hr capsule Commonly known as: EFFEXOR-XR Take 75 mg by mouth daily.   Vitamin D3 Ultra Potency 1.25 MG (50000 UT) Tabs Generic drug: Cholecalciferol Take by mouth. Once a week        Allergies:  Allergies  Allergen Reactions   Ace Inhibitors Other (See Comments)   Amlodipine Other (See Comments)   Doxazosin Other (See Comments)   Hydrocodone-Acetaminophen Other (See Comments)   Metronidazole Other (See Comments)   Percocet [Oxycodone-Acetaminophen] Other (See Comments)   Sulfa Antibiotics Other (See Comments)    Family History: Family History  Problem Relation Age of Onset   Breast cancer Maternal Aunt 45   Breast cancer Maternal Grandmother 49    Social History:  has no history on file for tobacco use, alcohol use, and drug use.   Physical Exam: BP 137/86   Pulse (!) 106   Ht '5\' 8"'$  (1.727 m)   Wt 216 lb (98 kg)   BMI 32.84 kg/m   Constitutional:  Alert and oriented, No acute distress. HEENT: [place] AT, moist mucus membranes.  Trachea midline, no masses. Neurologic: Grossly intact, no focal deficits, moving all 4 extremities. Psychiatric: Normal mood and affect.  Laboratory Data: Urinalysis    Component Value Date/Time   APPEARANCEUR Hazy (A) 03/10/2022 1009   GLUCOSEU Negative 03/10/2022 1009   BILIRUBINUR Negative 03/10/2022 1009   PROTEINUR Negative 03/10/2022 1009   NITRITE Negative 03/10/2022 1009   LEUKOCYTESUR 1+ (A) 03/10/2022 1009     Lab Results  Component Value Date   LABMICR See below: 03/10/2022   WBCUA 6-10 (A) 03/10/2022   LABEPIT >10 (A) 03/10/2022   MUCUS Present (A) 03/10/2022   BACTERIA Moderate (A) 03/10/2022    Assessment & Plan:    Presumed UTI's  - Her symptoms are malodorous urine and vaginal itching and are not specific for urinary tract.  - She's also had no real documented UTIs other than the one in January, which may or may not be real.   - Encouraged her to present when she is having symptoms and explained specific lower urinary tract symptoms that would in fact associate with a  UTI. And on those occasions, she'll come in for same and next day reevaluation  - In the meantime, she'll try cranberry tablets, probiotics, and D-Mannose.  2. Malodorous urine  - Ammonia smell can be normal.   - Encourage hydration.  - Non-specific for UTI's.  - Follow up as needed.  Return if symptoms worsen or fail to improve.  I have reviewed the above documentation for accuracy and completeness, and I agree with the above.   Hollice Espy, MD   [facility] [address], [place],  [address] [phone]

## 2022-03-24 ENCOUNTER — Ambulatory Visit
Admission: RE | Admit: 2022-03-24 | Discharge: 2022-03-24 | Disposition: A | Discharge status: Home / Self Care | Payer: Medicare PPO | Source: Ambulatory Visit | Attending: Internal Medicine | Admitting: Internal Medicine

## 2022-03-24 DIAGNOSIS — Z1231 Encounter for screening mammogram for malignant neoplasm of breast: Secondary | ICD-10-CM

## 2022-09-24 ENCOUNTER — Other Ambulatory Visit: Payer: Self-pay | Admitting: Otolaryngology

## 2022-09-24 DIAGNOSIS — J324 Chronic pansinusitis: Secondary | ICD-10-CM

## 2022-09-24 DIAGNOSIS — J339 Nasal polyp, unspecified: Secondary | ICD-10-CM

## 2022-10-06 ENCOUNTER — Encounter: Payer: Self-pay | Admitting: Otolaryngology

## 2022-10-07 ENCOUNTER — Ambulatory Visit
Admission: RE | Admit: 2022-10-07 | Discharge: 2022-10-07 | Disposition: A | Discharge status: Home / Self Care | Payer: Medicare PPO | Source: Ambulatory Visit | Attending: Otolaryngology | Admitting: Otolaryngology

## 2022-10-07 DIAGNOSIS — J339 Nasal polyp, unspecified: Secondary | ICD-10-CM

## 2022-10-07 DIAGNOSIS — J324 Chronic pansinusitis: Secondary | ICD-10-CM

## 2023-05-24 ENCOUNTER — Other Ambulatory Visit: Payer: Self-pay | Admitting: Internal Medicine

## 2023-05-24 DIAGNOSIS — Z1231 Encounter for screening mammogram for malignant neoplasm of breast: Secondary | ICD-10-CM

## 2023-06-03 ENCOUNTER — Ambulatory Visit
Admission: RE | Admit: 2023-06-03 | Discharge: 2023-06-03 | Disposition: A | Discharge status: Home / Self Care | Source: Ambulatory Visit | Attending: Internal Medicine | Admitting: Internal Medicine

## 2023-06-03 DIAGNOSIS — Z1231 Encounter for screening mammogram for malignant neoplasm of breast: Secondary | ICD-10-CM | POA: Insufficient documentation

## 2023-06-14 ENCOUNTER — Other Ambulatory Visit: Payer: Self-pay | Admitting: Internal Medicine

## 2023-06-14 DIAGNOSIS — R928 Other abnormal and inconclusive findings on diagnostic imaging of breast: Secondary | ICD-10-CM

## 2023-06-15 ENCOUNTER — Ambulatory Visit
Admission: RE | Admit: 2023-06-15 | Discharge: 2023-06-15 | Disposition: A | Discharge status: Home / Self Care | Source: Ambulatory Visit | Attending: Internal Medicine | Admitting: Internal Medicine

## 2023-06-15 DIAGNOSIS — R928 Other abnormal and inconclusive findings on diagnostic imaging of breast: Secondary | ICD-10-CM | POA: Insufficient documentation

## 2023-06-23 ENCOUNTER — Ambulatory Visit
Admission: RE | Admit: 2023-06-23 | Discharge: 2023-06-23 | Disposition: A | Discharge status: Home / Self Care | Source: Ambulatory Visit | Attending: Internal Medicine | Admitting: Internal Medicine

## 2023-06-23 DIAGNOSIS — N6489 Other specified disorders of breast: Secondary | ICD-10-CM | POA: Insufficient documentation

## 2023-06-23 DIAGNOSIS — R928 Other abnormal and inconclusive findings on diagnostic imaging of breast: Secondary | ICD-10-CM | POA: Diagnosis present

## 2023-06-23 MED ORDER — LIDOCAINE-EPINEPHRINE 1 %-1:100000 IJ SOLN
20.0000 mL | Freq: Once | INTRAMUSCULAR | Status: AC
  Administered 2023-06-23: 20 mL
  Filled 2023-06-23: qty 20

## 2023-06-23 MED ORDER — LIDOCAINE 1 % OPTIME INJ - NO CHARGE
5.0000 mL | Freq: Once | INTRAMUSCULAR | Status: AC
  Administered 2023-06-23: 5 mL
  Filled 2023-06-23: qty 6

## 2023-06-24 LAB — SURGICAL PATHOLOGY

## 2024-02-03 ENCOUNTER — Other Ambulatory Visit: Payer: Self-pay | Admitting: Orthopedic Surgery

## 2024-02-05 ENCOUNTER — Encounter: Payer: Self-pay | Admitting: Urgent Care

## 2024-02-09 ENCOUNTER — Encounter: Payer: Self-pay | Admitting: Orthopedic Surgery

## 2024-02-09 ENCOUNTER — Telehealth: Payer: Self-pay | Admitting: Urgent Care

## 2024-02-09 ENCOUNTER — Encounter
Admission: RE | Admit: 2024-02-09 | Discharge: 2024-02-09 | Disposition: A | Discharge status: Home / Self Care | Source: Ambulatory Visit | Attending: Orthopedic Surgery | Admitting: Orthopedic Surgery

## 2024-02-09 ENCOUNTER — Other Ambulatory Visit: Payer: Self-pay

## 2024-02-09 ENCOUNTER — Encounter: Payer: Self-pay | Admitting: Urgent Care

## 2024-02-09 VITALS — BP 135/92 | HR 108 | Resp 18 | Wt 218.7 lb

## 2024-02-09 DIAGNOSIS — Z01812 Encounter for preprocedural laboratory examination: Secondary | ICD-10-CM | POA: Diagnosis present

## 2024-02-09 DIAGNOSIS — Z79899 Other long term (current) drug therapy: Secondary | ICD-10-CM | POA: Diagnosis not present

## 2024-02-09 DIAGNOSIS — M25551 Pain in right hip: Secondary | ICD-10-CM

## 2024-02-09 DIAGNOSIS — E119 Type 2 diabetes mellitus without complications: Secondary | ICD-10-CM | POA: Insufficient documentation

## 2024-02-09 DIAGNOSIS — Z22322 Carrier or suspected carrier of Methicillin resistant Staphylococcus aureus: Secondary | ICD-10-CM

## 2024-02-09 DIAGNOSIS — Z01810 Encounter for preprocedural cardiovascular examination: Secondary | ICD-10-CM | POA: Diagnosis present

## 2024-02-09 DIAGNOSIS — Z01818 Encounter for other preprocedural examination: Secondary | ICD-10-CM | POA: Diagnosis not present

## 2024-02-09 HISTORY — DX: Unspecified osteoarthritis, unspecified site: M19.90

## 2024-02-09 HISTORY — DX: Depression, unspecified: F32.A

## 2024-02-09 HISTORY — DX: Type 2 diabetes mellitus without complications: E11.9

## 2024-02-09 HISTORY — DX: Headache, unspecified: R51.9

## 2024-02-09 HISTORY — DX: Constipation, unspecified: K59.00

## 2024-02-09 HISTORY — DX: Thyrotoxicosis with diffuse goiter without thyrotoxic crisis or storm: E05.00

## 2024-02-09 HISTORY — DX: Hypothyroidism, unspecified: E03.9

## 2024-02-09 HISTORY — DX: Anemia, unspecified: D64.9

## 2024-02-09 HISTORY — DX: Anxiety disorder, unspecified: F41.9

## 2024-02-09 HISTORY — DX: Hyperlipidemia, unspecified: E78.5

## 2024-02-09 HISTORY — DX: Primary hyperparathyroidism: E21.0

## 2024-02-09 HISTORY — DX: Gastro-esophageal reflux disease without esophagitis: K21.9

## 2024-02-09 HISTORY — DX: Unspecified asthma, uncomplicated: J45.909

## 2024-02-09 LAB — CBC
HCT: 38 % (ref 36.0–46.0)
Hemoglobin: 12.1 g/dL (ref 12.0–15.0)
MCH: 27.9 pg (ref 26.0–34.0)
MCHC: 31.8 g/dL (ref 30.0–36.0)
MCV: 87.6 fL (ref 80.0–100.0)
Platelets: 208 10*3/uL (ref 150–400)
RBC: 4.34 MIL/uL (ref 3.87–5.11)
RDW: 15.5 % (ref 11.5–15.5)
WBC: 6.9 10*3/uL (ref 4.0–10.5)
nRBC: 0 % (ref 0.0–0.2)

## 2024-02-09 LAB — HEMOGLOBIN A1C
Hgb A1c MFr Bld: 7.3 % — ABNORMAL HIGH (ref 4.8–5.6)
Mean Plasma Glucose: 162.81 mg/dL

## 2024-02-09 LAB — COMPREHENSIVE METABOLIC PANEL WITH GFR
ALT: 437 U/L — ABNORMAL HIGH (ref 0–44)
AST: 154 U/L — ABNORMAL HIGH (ref 15–41)
Albumin: 4.2 g/dL (ref 3.5–5.0)
Alkaline Phosphatase: 156 U/L — ABNORMAL HIGH (ref 38–126)
Anion gap: 11 (ref 5–15)
BUN: 13 mg/dL (ref 8–23)
CO2: 26 mmol/L (ref 22–32)
Calcium: 9.3 mg/dL (ref 8.9–10.3)
Chloride: 99 mmol/L (ref 98–111)
Creatinine, Ser: 0.84 mg/dL (ref 0.44–1.00)
GFR, Estimated: >60 mL/min
Glucose, Bld: 112 mg/dL — ABNORMAL HIGH (ref 70–99)
Potassium: 4.1 mmol/L (ref 3.5–5.1)
Sodium: 137 mmol/L (ref 135–145)
Total Bilirubin: 0.3 mg/dL (ref 0.0–1.2)
Total Protein: 7.7 g/dL (ref 6.5–8.1)

## 2024-02-09 LAB — IRON AND TIBC
Iron: 73 ug/dL (ref 28–170)
Saturation Ratios: 20 % (ref 10.4–31.8)
TIBC: 361 ug/dL (ref 250–450)
UIBC: 289 ug/dL

## 2024-02-09 LAB — URINALYSIS, ROUTINE W REFLEX MICROSCOPIC
Bilirubin Urine: NEGATIVE
Glucose, UA: NEGATIVE mg/dL
Hgb urine dipstick: NEGATIVE
Ketones, ur: NEGATIVE mg/dL
Leukocytes,Ua: NEGATIVE
Nitrite: NEGATIVE
Protein, ur: NEGATIVE mg/dL
Specific Gravity, Urine: 1.014 (ref 1.005–1.030)
pH: 7 (ref 5.0–8.0)

## 2024-02-09 LAB — SURGICAL PCR SCREEN
MRSA, PCR: POSITIVE — AB
Staphylococcus aureus: POSITIVE — AB

## 2024-02-09 LAB — FERRITIN: Ferritin: 290 ng/mL (ref 11–307)

## 2024-02-09 LAB — ACETAMINOPHEN LEVEL: Acetaminophen (Tylenol), Serum: <10 ug/mL — ABNORMAL LOW (ref 10–30)

## 2024-02-09 MED ORDER — MUPIROCIN 2 % EX OINT: TOPICAL_OINTMENT | CUTANEOUS | 0 refills | Status: AC

## 2024-02-09 NOTE — Progress Notes (Signed)
" °  Perioperative Services  Abnormal Lab Notification and Treatment Plan of Care  Date: 02/09/24  Name: Amy Fritz DOB: 18-Mar-1945 MRN:   [id number]  Re: Abnormal labs noted during PAT appointment  Provider Notified: Lorelle Hussar, MD (Surgeon) Notification mode: Routed and/or faxed via [facility]  Labs of concern: Lab Results  Component Value Date   STAPHAUREUS POSITIVE (A) 02/09/2024   MRSAPCR POSITIVE (A) 02/09/2024    Notes: Patient is scheduled for an elective ARTHROPLASTY, HIP, TOTAL, ANTERIOR APPROACH on 02/15/2024. She is scheduled to receive CEFAZOLIN pre-operatively. Pre-surgical PCR (+) for MRSA; see above.  PLANS:  Review renal function. BUN 13 and creatinine 0.84  Review allergies. No documented allergy to vancomycin.  Order added for VANCOMYCIN 1 GRAM IV to current preoperative prophylactic regimen.   Patient with orders for both CEFAZOLIN + VANCOMYCIN to be given in the setting of documented MRSA (+) surgical PCR.   Guidelines suggest that a beta-lactam antibiotic (first or second generation cephalosporin) should be added for activity against gram-negative organisms.  Vancomycin appears to be less effective than cefazolin for preventing SSIs caused by MSSA. For this reason, the use of vancomycin in combination with cefazolin is favored for prevention of SSI due to MRSA and coagulase-negative staphylococci.  Medical history in [facility] updated to reflect (+) PCR result indicating nasal MRSA colonization   Rx for additional preoperative nasal decolonization sent to patient's pharmacy of record.   Meds ordered this encounter  Medications   mupirocin  ointment (BACTROBAN ) 2 %    Sig: Apply small amount to the inside of both nostrils TWICE a day for the next 5 days.    Dispense:  15 g    Refill:  0    Please contact the patient as soon as it is available for pickup. Rx is for preoperative nasal decolonization following (+) MRSA result on PCR testing. Needs to be started  ASAP.   Encounter Diagnoses  Name Primary?   Pre-operative laboratory examination Yes   Nose colonized with MRSA    Dorise Pereyra, MSN, APRN, FNP-C, CEN [facility]  Perioperative Services Nurse Practitioner Phone: [phone] 02/09/24 2:01 PM "

## 2024-02-09 NOTE — Patient Instructions (Addendum)
 Your procedure is scheduled on: 02/15/24 - Tuesday Report to the Registration Desk on the 1st floor of the [facility]. To find out your arrival time, please call [phone] between 1PM - 3PM on: 02/14/24 - Monday If your arrival time is 6:00 am, do not arrive before that time as the [facility] entrance doors do not open until 6:00 am.  REMEMBER: Instructions that are not followed completely may result in serious medical risk, up to and including death; or upon the discretion of your surgeon and anesthesiologist your surgery may need to be rescheduled.  Do not eat food after midnight the night before surgery.  No gum chewing or hard candies.  You may however, drink CLEAR liquids up to 2 hours before you are scheduled to arrive for your surgery. Do not drink anything within 2 hours of your scheduled arrival time.  Clear liquids include: - water   In addition, your doctor has ordered for you to drink the provided:   Gatorade G2 Drinking this carbohydrate drink up to two hours before surgery helps to reduce insulin resistance and improve patient outcomes. Please complete drinking 2 hours before scheduled arrival time.  One week prior to surgery: Stop Anti-inflammatories (NSAIDS) such as Advil, Aleve, Ibuprofen, Motrin, Naproxen, Naprosyn and Aspirin based products such as Excedrin, Goody's Powder, BC Powder. You may take Tylenol if needed for pain up until the day of surgery.  Stop ANY OVER THE COUNTER supplements until after surgery - magnesium oxide   metFORMIN (GLUCOPHAGE) hold beginning on 02/13/24.  ON THE DAY OF SURGERY ONLY TAKE THESE MEDICATIONS WITH SIPS OF WATER:  levothyroxine (SYNTHROID)  omeprazole (PRILOSEC)  TRELEGY ELLIPTA    No Alcohol  for 24 hours before or after surgery.  No Smoking including e-cigarettes for 24 hours before surgery.  No chewable tobacco products for at least 6 hours before surgery.  No nicotine patches on the day of surgery.  Do not use  any recreational drugs for at least a week (preferably 2 weeks) before your surgery.  Please be advised that the combination of cocaine and anesthesia may have negative outcomes, up to and including death. If you test positive for cocaine, your surgery will be cancelled.  On the morning of surgery brush your teeth with toothpaste and water, you may rinse your mouth with mouthwash if you wish. Do not swallow any toothpaste or mouthwash.  Use CHG Soap or wipes as directed on instruction sheet.  Do not wear jewelry, make-up, hairpins, clips or nail polish.  For welded (permanent) jewelry: bracelets, anklets, waist bands, etc.  Please have this removed prior to surgery.  If it is not removed, there is a chance that hospital personnel will need to cut it off on the day of surgery.  Do not wear lotions, powders, or perfumes.   Do not shave body hair from the neck down 48 hours before surgery.  Contact lenses, hearing aids and dentures may not be worn into surgery.  Do not bring valuables to the hospital. [facility] is not responsible for any missing/lost belongings or valuables.   Notify your doctor if there is any change in your medical condition (cold, fever, infection).  Wear comfortable clothing (specific to your surgery type) to the hospital.  After surgery, you can help prevent lung complications by doing breathing exercises.  Take deep breaths and cough every 1-2 hours. Your doctor may order a device called an Incentive Spirometer to help you take deep breaths.  If you are  being admitted to the hospital overnight, leave your suitcase in the car. After surgery it may be brought to your room.  In case of increased patient census, it may be necessary for you, the patient, to continue your postoperative care in the Same Day Surgery department.  If you are being discharged the day of surgery, you will not be allowed to drive home. You will need a responsible individual to drive you  home and stay with you for 24 hours after surgery.   If you are taking public transportation, you will need to have a responsible individual with you.  Please call the Pre-admissions Testing Dept. at [phone] if you have any questions about these instructions.  Surgery Visitation Policy:  Patients having surgery or a procedure may have two visitors.  Children under the age of 82 must have an adult with them who is not the patient.  Inpatient Visitation:    Visiting hours are 7 a.m. to 8 p.m. Up to four visitors are allowed at one time in a patient room. The visitors may rotate out with other people during the day.  One visitor age 33 or older may stay with the patient overnight and must be in the room by 8 p.m.   Merchandiser, Retail to address health-related social needs:  [email or web address]        Pre-operative 4 CHG Bath Instructions   You can play a key role in reducing the risk of infection after surgery. Your skin needs to be as free of germs as possible. You can reduce the number of germs on your skin by washing with CHG (chlorhexidine gluconate) soap before surgery. CHG is an antiseptic soap that kills germs and continues to kill germs even after washing.   DO NOT use if you have an allergy to chlorhexidine/CHG or antibacterial soaps. If your skin becomes reddened or irritated, stop using the CHG and notify one of our RNs at [phone].   Please shower with the CHG soap starting 4 days before surgery using the following schedule: 01/30 - 02/03.    Please keep in mind the following:  DO NOT shave, including legs and underarms, starting the day of your first shower.   You may shave your face at any point before/day of surgery.  Place clean sheets on your bed the day you start using CHG soap. Use a clean washcloth (not used since being washed) for each shower. DO NOT sleep with pets once you start using the CHG.   CHG Shower Instructions:   If you choose to wash your hair and private area, wash first with your normal shampoo/soap.  After you use shampoo/soap, rinse your hair and body thoroughly to remove shampoo/soap residue.  Turn the water OFF and apply about 3 tablespoons (45 ml) of CHG soap to a CLEAN washcloth.  Apply CHG soap ONLY FROM YOUR NECK DOWN TO YOUR TOES (washing for 3-5 minutes)  DO NOT use CHG soap on face, private areas, open wounds, or sores.  Pay special attention to the area where your surgery is being performed.  If you are having back surgery, having someone wash your back for you may be helpful. Wait 2 minutes after CHG soap is applied, then you may rinse off the CHG soap.  Pat dry with a clean towel  Put on clean clothes/pajamas   If you choose to wear lotion, please use ONLY the CHG-compatible lotions on the back of this paper.     Additional instructions for  the day of surgery: DO NOT APPLY any lotions, deodorants, cologne, or perfumes.   Put on clean/comfortable clothes.  Brush your teeth.  Ask your nurse before applying any prescription medications to the skin.      CHG Compatible Lotions   Aveeno Moisturizing lotion  Cetaphil Moisturizing Cream  Cetaphil Moisturizing Lotion  Clairol Herbal Essence Moisturizing Lotion, Dry Skin  Clairol Herbal Essence Moisturizing Lotion, Extra Dry Skin  Clairol Herbal Essence Moisturizing Lotion, Normal Skin  Curel Age Defying Therapeutic Moisturizing Lotion with Alpha Hydroxy  Curel Extreme Care Body Lotion  Curel Soothing Hands Moisturizing Hand Lotion  Curel Therapeutic Moisturizing Cream, Fragrance-Free  Curel Therapeutic Moisturizing Lotion, Fragrance-Free  Curel Therapeutic Moisturizing Lotion, Original Formula  Eucerin Daily Replenishing Lotion  Eucerin Dry Skin Therapy Plus Alpha Hydroxy Crme  Eucerin Dry Skin Therapy Plus Alpha Hydroxy Lotion  Eucerin Original Crme  Eucerin Original Lotion  Eucerin Plus Crme Eucerin Plus Lotion   Eucerin TriLipid Replenishing Lotion  Keri Anti-Bacterial Hand Lotion  Keri Deep Conditioning Original Lotion Dry Skin Formula Softly Scented  Keri Deep Conditioning Original Lotion, Fragrance Free Sensitive Skin Formula  Keri Lotion Fast Absorbing Fragrance Free Sensitive Skin Formula  Keri Lotion Fast Absorbing Softly Scented Dry Skin Formula  Keri Original Lotion  Keri Skin Renewal Lotion Keri Silky Smooth Lotion  Keri Silky Smooth Sensitive Skin Lotion  Nivea Body Creamy Conditioning Oil  Nivea Body Extra Enriched Lotion  Nivea Body Original Lotion  Nivea Body Sheer Moisturizing Lotion Nivea Crme  Nivea Skin Firming Lotion  NutraDerm 30 Skin Lotion  NutraDerm Skin Lotion  NutraDerm Therapeutic Skin Cream  NutraDerm Therapeutic Skin Lotion  ProShield Protective Hand Cream  Provon moisturizing lotion  How to Use an Incentive Spirometer  An incentive spirometer is a tool that measures how well you are filling your lungs with each breath. Learning to take long, deep breaths using this tool can help you keep your lungs clear and active. This may help to reverse or lessen your chance of developing breathing (pulmonary) problems, especially infection. You may be asked to use a spirometer: After a surgery. If you have a lung problem or a history of smoking. After a long period of time when you have been unable to move or be active. If the spirometer includes an indicator to show the highest number that you have reached, your health care provider or respiratory therapist will help you set a goal. Keep a log of your progress as told by your health care provider. What are the risks? Breathing too quickly may cause dizziness or cause you to pass out. Take your time so you do not get dizzy or light-headed. If you are in pain, you may need to take pain medicine before doing incentive spirometry. It is harder to take a deep breath if you are having pain. How to use your incentive  spirometer  Sit up on the edge of your bed or on a chair. Hold the incentive spirometer so that it is in an upright position. Before you use the spirometer, breathe out normally. Place the mouthpiece in your mouth. Make sure your lips are closed tightly around it. Breathe in slowly and as deeply as you can through your mouth, causing the piston or the ball to rise toward the top of the chamber. Hold your breath for 3-5 seconds, or for as long as possible. If the spirometer includes a coach indicator, use this to guide you in breathing. Slow down your breathing if the  indicator goes above the marked areas. Remove the mouthpiece from your mouth and breathe out normally. The piston or ball will return to the bottom of the chamber. Rest for a few seconds, then repeat the steps 10 or more times. Take your time and take a few normal breaths between deep breaths so that you do not get dizzy or light-headed. Do this every 1-2 hours when you are awake. If the spirometer includes a goal marker to show the highest number you have reached (best effort), use this as a goal to work toward during each repetition. After each set of 10 deep breaths, cough a few times. This will help to make sure that your lungs are clear. If you have an incision on your chest or abdomen from surgery, place a pillow or a rolled-up towel firmly against the incision when you cough. This can help to reduce pain while taking deep breaths and coughing. General tips When you are able to get out of bed: Walk around often. Continue to take deep breaths and cough in order to clear your lungs. Keep using the incentive spirometer until your health care provider says it is okay to stop using it. If you have been in the hospital, you may be told to keep using the spirometer at home. Contact a health care provider if: You are having difficulty using the spirometer. You have trouble using the spirometer as often as instructed. Your pain  medicine is not giving enough relief for you to use the spirometer as told. You have a fever. Get help right away if: You develop shortness of breath. You develop a cough with bloody mucus from the lungs. You have fluid or blood coming from an incision site after you cough. Summary An incentive spirometer is a tool that can help you learn to take long, deep breaths to keep your lungs clear and active. You may be asked to use a spirometer after a surgery, if you have a lung problem or a history of smoking, or if you have been inactive for a long period of time. Use your incentive spirometer as instructed every 1-2 hours while you are awake. If you have an incision on your chest or abdomen, place a pillow or a rolled-up towel firmly against your incision when you cough. This will help to reduce pain. Get help right away if you have shortness of breath, you cough up bloody mucus, or blood comes from your incision when you cough. This information is not intended to replace advice given to you by your health care provider. Make sure you discuss any questions you have with your health care provider. Document Revised: 03/20/2019 Document Reviewed: 03/20/2019 Elsevier Patient Education  2023 [employer]   Preoperative Educational Videos for Total Hip, Knee and Shoulder Replacements  To better prepare for surgery, please view our videos that explain the physical activity and discharge planning required to have the best surgical recovery at [facility].  [email or web address]  Questions? Call [phone] or email [email or web address]

## 2024-02-09 NOTE — Progress Notes (Signed)
" °  [facility] Perioperative Services: Pre-Admission/Anesthesia Testing  Abnormal Lab Notification   Date: 02/09/24  Name: Amy Fritz MRN:   [id number]  Re: Abnormal labs noted during PAT appointment   Notified:    Provider Name Provider Role Notification Mode  Lorelle Hussar, MD Orthopedics (Surgeon) Routed and/or faxed via [facility] Sadie Manna, MD Internal Medicine (PCP) Routed and/or faxed via [facility]   ABNORMAL LAB VALUE(S):   Lab Results  Component Value Date   AST 154 (H) 02/09/2024   ALT 437 (H) 02/09/2024   ALKPHOS 156 (H) 02/09/2024   Clinical Information and Notes:  Amy Fritz is scheduled for a ARTHROPLASTY, HIP, TOTAL, ANTERIOR APPROACH (Right: Hip) on 02/15/2024.   Preoperative labs show an acute transaminitis. Patient denies ETOH and excessive NSAID use. No recent illness. Patient not complaining of significant upper abdominal pain to point to cholecystitis/choledocholithiasis.   Differentials include viral, toxicity, metabolic, autoimmune, and obstructive etiologies. Attempted to add additional lab studies added as follows: GGT, acute hepatitis panel, APAP level, iron studies with ferritin, ceruloplasmin, alpha 1 antitrypsin, ANA, anti-smooth muscle Ab.  Despite orders, lab only able to process a limited number of the ordered studies due to limited specimen availability, including both blood volume and the correct tubes. Of those processed, the following were able to be processed/resulted:  APAP normal at < 10 ug/mL Fe normal at 73 ug/dL TIBC normal at  638 ug/dL Ferritin normal at 709 ng/mL  Patient will need further lab testing to determine etiology of transaminitis. May potentially benefit from imaging as well. Will forward results to internal medicine provider and attending surgeon for review and consideration of further evaluation going forward.   Dorise Pereyra, MSN, APRN, FNP-C, CEN [facility]  Perioperative  Services Nurse Practitioner Phone: [phone] Fax: [phone] 02/09/24 12:10 PM  "

## 2024-02-10 ENCOUNTER — Other Ambulatory Visit: Payer: Self-pay | Admitting: Orthopedic Surgery

## 2024-02-11 ENCOUNTER — Other Ambulatory Visit: Payer: Self-pay | Admitting: Internal Medicine

## 2024-02-11 DIAGNOSIS — R7989 Other specified abnormal findings of blood chemistry: Secondary | ICD-10-CM

## 2024-02-15 ENCOUNTER — Ambulatory Visit: Admission: RE | Admit: 2024-02-15 | Source: Home / Self Care | Admitting: Orthopedic Surgery

## 2024-02-15 ENCOUNTER — Encounter: Admission: RE | Payer: Self-pay | Source: Home / Self Care

## 2024-02-15 DIAGNOSIS — M169 Osteoarthritis of hip, unspecified: Secondary | ICD-10-CM

## 2024-02-15 DIAGNOSIS — Z01812 Encounter for preprocedural laboratory examination: Secondary | ICD-10-CM

## 2024-02-15 DIAGNOSIS — R7401 Elevation of levels of liver transaminase levels: Secondary | ICD-10-CM

## 2024-02-15 HISTORY — DX: Diaphragmatic hernia without obstruction or gangrene: K44.9

## 2024-02-16 ENCOUNTER — Ambulatory Visit: Admission: RE | Admit: 2024-02-16 | Discharge: 2024-02-16 | Attending: Internal Medicine | Admitting: Internal Medicine

## 2024-02-16 DIAGNOSIS — R7989 Other specified abnormal findings of blood chemistry: Secondary | ICD-10-CM
